# Patient Record
Sex: Female | Born: 1955 | Race: White | Hispanic: No | Marital: Married | Smoking: Never smoker
Health system: Southern US, Community
[De-identification: ages and names within clinical notes are randomized; demographics above are authoritative.]

## PROBLEM LIST (undated history)

## (undated) DIAGNOSIS — M810 Age-related osteoporosis without current pathological fracture: Secondary | ICD-10-CM

## (undated) DIAGNOSIS — B354 Tinea corporis: Secondary | ICD-10-CM

## (undated) DIAGNOSIS — I700 Atherosclerosis of aorta: Secondary | ICD-10-CM

## (undated) DIAGNOSIS — E041 Nontoxic single thyroid nodule: Secondary | ICD-10-CM

## (undated) DIAGNOSIS — E7800 Pure hypercholesterolemia, unspecified: Secondary | ICD-10-CM

## (undated) DIAGNOSIS — E785 Hyperlipidemia, unspecified: Secondary | ICD-10-CM

## (undated) HISTORY — PX: VEIN LIGATION AND STRIPPING: SHX2653

## (undated) HISTORY — PX: TUBAL LIGATION: SHX77

## (undated) HISTORY — DX: Pure hypercholesterolemia, unspecified: E78.00

## (undated) HISTORY — DX: Age-related osteoporosis without current pathological fracture: M81.0

## (undated) HISTORY — DX: Nontoxic single thyroid nodule: E04.1

## (undated) HISTORY — DX: Atherosclerosis of aorta: I70.0

## (undated) HISTORY — DX: Tinea corporis: B35.4

---

## 1998-06-28 ENCOUNTER — Other Ambulatory Visit: Admission: RE | Admit: 1998-06-28 | Discharge: 1998-06-28 | Payer: Self-pay | Admitting: Obstetrics and Gynecology

## 2000-10-25 ENCOUNTER — Emergency Department (HOSPITAL_COMMUNITY): Admission: EM | Admit: 2000-10-25 | Discharge: 2000-10-25 | Payer: Self-pay | Admitting: Emergency Medicine

## 2000-12-19 ENCOUNTER — Other Ambulatory Visit: Admission: RE | Admit: 2000-12-19 | Discharge: 2000-12-19 | Payer: Self-pay | Admitting: Obstetrics and Gynecology

## 2003-04-01 ENCOUNTER — Other Ambulatory Visit: Admission: RE | Admit: 2003-04-01 | Discharge: 2003-04-01 | Payer: Self-pay | Admitting: Obstetrics and Gynecology

## 2004-07-18 ENCOUNTER — Other Ambulatory Visit: Admission: RE | Admit: 2004-07-18 | Discharge: 2004-07-18 | Payer: Self-pay | Admitting: Obstetrics and Gynecology

## 2005-09-18 ENCOUNTER — Other Ambulatory Visit: Admission: RE | Admit: 2005-09-18 | Discharge: 2005-09-18 | Payer: Self-pay | Admitting: Obstetrics and Gynecology

## 2006-10-31 ENCOUNTER — Encounter: Admission: RE | Admit: 2006-10-31 | Discharge: 2006-10-31 | Payer: Self-pay | Admitting: Obstetrics and Gynecology

## 2006-12-03 ENCOUNTER — Encounter: Admission: RE | Admit: 2006-12-03 | Discharge: 2006-12-03 | Payer: Self-pay | Admitting: Surgery

## 2006-12-03 ENCOUNTER — Other Ambulatory Visit: Admission: RE | Admit: 2006-12-03 | Discharge: 2006-12-03 | Payer: Self-pay | Admitting: Diagnostic Radiology

## 2010-12-27 ENCOUNTER — Other Ambulatory Visit (HOSPITAL_COMMUNITY): Payer: Self-pay | Admitting: Obstetrics and Gynecology

## 2010-12-27 DIAGNOSIS — Z8639 Personal history of other endocrine, nutritional and metabolic disease: Secondary | ICD-10-CM

## 2011-01-01 ENCOUNTER — Other Ambulatory Visit (HOSPITAL_COMMUNITY): Payer: Self-pay

## 2011-01-21 ENCOUNTER — Other Ambulatory Visit (HOSPITAL_COMMUNITY): Payer: Self-pay

## 2011-01-22 ENCOUNTER — Ambulatory Visit (HOSPITAL_COMMUNITY)
Admission: RE | Admit: 2011-01-22 | Discharge: 2011-01-22 | Disposition: A | Discharge status: Home / Self Care | Payer: BC Managed Care – PPO | Source: Ambulatory Visit | Attending: Obstetrics and Gynecology | Admitting: Obstetrics and Gynecology

## 2011-01-22 ENCOUNTER — Other Ambulatory Visit (HOSPITAL_COMMUNITY): Payer: Self-pay | Admitting: Obstetrics and Gynecology

## 2011-01-22 DIAGNOSIS — E041 Nontoxic single thyroid nodule: Secondary | ICD-10-CM

## 2011-01-22 DIAGNOSIS — E042 Nontoxic multinodular goiter: Secondary | ICD-10-CM | POA: Insufficient documentation

## 2011-01-23 ENCOUNTER — Other Ambulatory Visit (HOSPITAL_COMMUNITY): Payer: BC Managed Care – PPO

## 2012-05-20 ENCOUNTER — Encounter (HOSPITAL_BASED_OUTPATIENT_CLINIC_OR_DEPARTMENT_OTHER): Payer: Self-pay

## 2012-05-20 ENCOUNTER — Emergency Department (HOSPITAL_BASED_OUTPATIENT_CLINIC_OR_DEPARTMENT_OTHER): Payer: BC Managed Care – PPO

## 2012-05-20 ENCOUNTER — Emergency Department (HOSPITAL_BASED_OUTPATIENT_CLINIC_OR_DEPARTMENT_OTHER)
Admission: EM | Admit: 2012-05-20 | Discharge: 2012-05-20 | Disposition: A | Discharge status: Home / Self Care | Payer: BC Managed Care – PPO | Attending: Emergency Medicine | Admitting: Emergency Medicine

## 2012-05-20 DIAGNOSIS — R000 Tachycardia, unspecified: Secondary | ICD-10-CM | POA: Insufficient documentation

## 2012-05-20 DIAGNOSIS — R002 Palpitations: Secondary | ICD-10-CM | POA: Insufficient documentation

## 2012-05-20 DIAGNOSIS — R209 Unspecified disturbances of skin sensation: Secondary | ICD-10-CM | POA: Insufficient documentation

## 2012-05-20 DIAGNOSIS — R55 Syncope and collapse: Secondary | ICD-10-CM

## 2012-05-20 DIAGNOSIS — E785 Hyperlipidemia, unspecified: Secondary | ICD-10-CM | POA: Insufficient documentation

## 2012-05-20 DIAGNOSIS — R42 Dizziness and giddiness: Secondary | ICD-10-CM | POA: Insufficient documentation

## 2012-05-20 DIAGNOSIS — R079 Chest pain, unspecified: Secondary | ICD-10-CM | POA: Insufficient documentation

## 2012-05-20 HISTORY — DX: Hyperlipidemia, unspecified: E78.5

## 2012-05-20 LAB — COMPREHENSIVE METABOLIC PANEL
ALT: 11 U/L (ref 0–35)
AST: 21 U/L (ref 0–37)
Alkaline Phosphatase: 103 U/L (ref 39–117)
BUN: 11 mg/dL (ref 6–23)
CO2: 25 mEq/L (ref 19–32)
GFR calc non Af Amer: >90 mL/min (ref >90)
Glucose, Bld: 122 mg/dL — ABNORMAL HIGH (ref 70–99)
Potassium: 4 mEq/L (ref 3.5–5.1)
Sodium: 139 mEq/L (ref 135–145)
Total Protein: 7.4 g/dL (ref 6.0–8.3)

## 2012-05-20 LAB — CBC WITH DIFFERENTIAL/PLATELET
Basophils Absolute: 0 10*3/uL (ref 0.0–0.1)
Eosinophils Relative: 1 % (ref 0–5)
Hemoglobin: 12.1 g/dL (ref 12.0–15.0)
Lymphs Abs: 1.4 10*3/uL (ref 0.7–4.0)
MCHC: 32.9 g/dL (ref 30.0–36.0)
Neutro Abs: 3.3 10*3/uL (ref 1.7–7.7)
Neutrophils Relative %: 61 % (ref 43–77)
Platelets: 287 10*3/uL (ref 150–400)
RDW: 12.5 % (ref 11.5–15.5)

## 2012-05-20 LAB — TROPONIN I: Troponin I: <0.3 ng/mL (ref <0.30)

## 2012-05-20 NOTE — ED Provider Notes (Signed)
History     CSN: [id number]  Arrival date & time 05/20/12  1234   First MD Initiated Contact with Patient 05/20/12 1245      Chief Complaint  Patient presents with  . Tachycardia  . Chest Pain    (Consider location/radiation/quality/duration/timing/severity/associated sxs/prior treatment) HPI Comments: Patient presents after episode of lightheadedness and palpitations while she was standing washing dishes. Symptoms came on suddenly and lasted about 45 minutes. She has down and symptoms improved. She developed tingling in her left arm with nausea, lightheadedness and chest pressure. The symptoms are now resolved. She's not had pain like this in the past. She admits to not eating or drinking today but has not ordered. No history of cardiac problems. She's a history of hyperlipidemia only. Has never had a stress test. She does not smoke. She's no abdominal pain, nausea vomiting. She denies any chest pain or shortness of breath. She feels back to baseline now.  The history is provided by the patient.    Past Medical History  Diagnosis Date  . Hyperlipemia     Past Surgical History  Procedure Date  . Tubal ligation   . Vein ligation and stripping     No family history on file.  History  Substance Use Topics  . Smoking status: Never Smoker   . Smokeless tobacco: Never Used  . Alcohol Use: 0.0 oz/week    1-2 Glasses of wine per week     daily    OB History    Grav Para Term Preterm Abortions TAB SAB Ect Mult Living                  Review of Systems  Constitutional: Negative for fever, activity change and appetite change.  HENT: Negative for congestion and rhinorrhea.   Respiratory: Positive for chest tightness. Negative for cough and shortness of breath.   Cardiovascular: Positive for palpitations. Negative for chest pain.  Gastrointestinal: Positive for nausea. Negative for vomiting and abdominal pain.  Genitourinary: Negative for dysuria, vaginal bleeding and vaginal  discharge.  Musculoskeletal: Negative for back pain.  Skin: Negative for rash.  Neurological: Positive for dizziness and light-headedness. Negative for weakness and headaches.    Allergies  Seconal and Codeine  Home Medications  No current outpatient prescriptions on file.  BP 110/79  Pulse 85  Temp 97.9 F (36.6 C) (Oral)  Resp 18  Ht 5\' 6"  (1.676 m)  Wt 130 lb (58.968 kg)  BMI 20.98 kg/m2  SpO2 100%  Physical Exam  Constitutional: She is oriented to person, place, and time. She appears well-developed and well-nourished. No distress.  HENT:  Head: Normocephalic and atraumatic.  Mouth/Throat: Oropharynx is clear and moist. No oropharyngeal exudate.  Eyes: Conjunctivae and EOM are normal. Pupils are equal, round, and reactive to light.  Neck: Normal range of motion. Neck supple.  Cardiovascular: Normal rate, regular rhythm and normal heart sounds.   No murmur heard. Pulmonary/Chest: Effort normal and breath sounds normal. No respiratory distress.  Abdominal: Soft. There is no tenderness. There is no rebound and no guarding.  Musculoskeletal: Normal range of motion. She exhibits no edema and no tenderness.  Neurological: She is alert and oriented to person, place, and time. No cranial nerve deficit.  Skin: Skin is warm.    ED Course  Procedures (including critical care time)  Labs Reviewed  COMPREHENSIVE METABOLIC PANEL - Abnormal; Notable for the following:    Glucose, Bld 122 (*)     All other components within  normal limits  CBC WITH DIFFERENTIAL  TROPONIN I  D-DIMER, QUANTITATIVE  TROPONIN I  URINALYSIS, ROUTINE W REFLEX MICROSCOPIC   Dg Chest 2 View  05/20/2012  *RADIOLOGY REPORT*  Clinical Data: Chest pain, palpitations  CHEST - 2 VIEW  Comparison: Chest x-ray of 12/03/2006  Findings: The lungs remain clear and slightly hyperaerated.  No focal infiltrate or effusion is seen.  Mediastinal contours appear normal.  The heart is within normal limits in size.  No  bony abnormality is seen.  IMPRESSION: No change in hyperaeration.  No active lung disease.   Original Report Authenticated By: Juline Patch, M.D.      1. Palpitations   2. Near syncope       MDM  Episode of lightheadedness, palpitations, nausea, tingling in the arm and chest pressure, resolved after 45 minutes. Initially tachycardic to 140s on arrival, but 90s by the time monitor applied.  EKG nonischemic, vital stable, no distress. Low suspicion for ACS. TIMI 0. Possibility of resolved SVT.  Possible component of anxiety.  Troponin negative, d-dimer negative, no recurrence of symptoms. Discussed with patient the low likelihood of ACS. She is agreeable to outpatient followup. declines observation today. She will schedule stress test with her Dr. Doreatha Lew return to the ED with no worsening symptoms.    Date: 05/20/2012  Rate: 87  Rhythm: normal sinus rhythm  QRS Axis: normal  Intervals: normal  ST/T Wave abnormalities: normal  Conduction Disutrbances:none  Narrative Interpretation:   Old EKG Reviewed: none available    Glynn Octave, MD 05/20/12 1540

## 2012-05-20 NOTE — ED Notes (Signed)
Pt reports palpitations, chest pressure and tingling in left arm.

## 2012-05-20 NOTE — ED Notes (Signed)
Patient transported to X-ray 

## 2012-05-20 NOTE — ED Notes (Signed)
Pt reports palpitations PTA lasting over 45 minutes.  Initial pulse in ED was 145 via radial pulse, however decreased to 88-89 and NSR once monitor was in place.

## 2014-04-30 ENCOUNTER — Encounter: Payer: Self-pay | Admitting: *Deleted

## 2014-07-19 ENCOUNTER — Other Ambulatory Visit: Payer: Self-pay | Admitting: Obstetrics and Gynecology

## 2014-07-20 LAB — CYTOLOGY - PAP

## 2014-07-21 ENCOUNTER — Other Ambulatory Visit (HOSPITAL_COMMUNITY): Payer: Self-pay | Admitting: Obstetrics and Gynecology

## 2014-07-21 DIAGNOSIS — E041 Nontoxic single thyroid nodule: Secondary | ICD-10-CM

## 2014-07-28 ENCOUNTER — Ambulatory Visit (HOSPITAL_COMMUNITY): Payer: BC Managed Care – PPO

## 2014-08-02 ENCOUNTER — Ambulatory Visit (HOSPITAL_COMMUNITY)
Admission: RE | Admit: 2014-08-02 | Discharge: 2014-08-02 | Disposition: A | Discharge status: Home / Self Care | Payer: BC Managed Care – PPO | Source: Ambulatory Visit | Attending: Obstetrics and Gynecology | Admitting: Obstetrics and Gynecology

## 2014-08-02 DIAGNOSIS — E041 Nontoxic single thyroid nodule: Secondary | ICD-10-CM

## 2016-02-26 IMAGING — US US SOFT TISSUE HEAD/NECK
1 series · 13 of 25 positions shown · non-contrast
Comparison: 01/22/2011; 10/31/2006; ultrasound-guided left-sided
thyroid nodule biopsy - 12/03/2006

CLINICAL DATA: Evaluate left-sided thyroid nodule.

EXAM:
THYROID ULTRASOUND
TECHNIQUE: Ultrasound examination of the thyroid gland and adjacent soft
tissues was performed.

[Series 1: us soft tissue head/neck · 0.06mm/px · 13 of 66 slices shown]
[im 1/66]
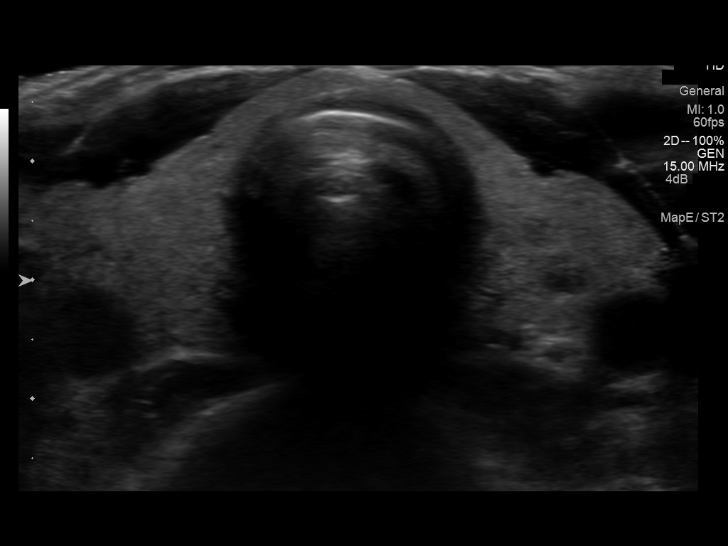
[im 6/66]
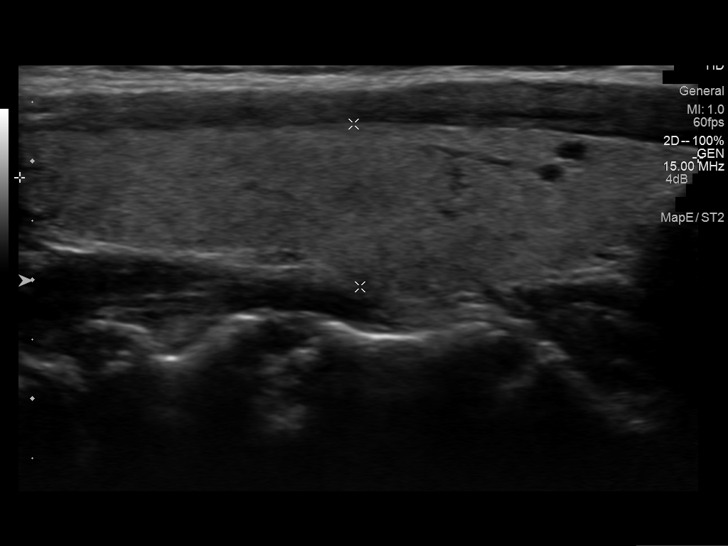
[im 11/66]
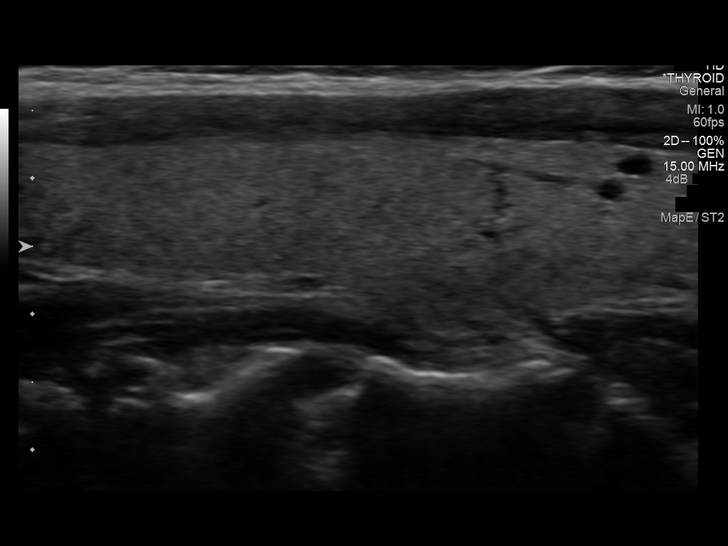
[im 17/66]
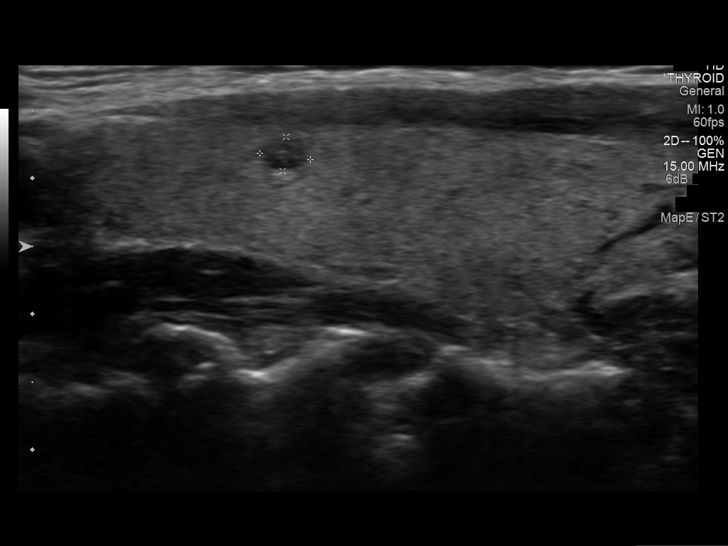
[im 22/66]
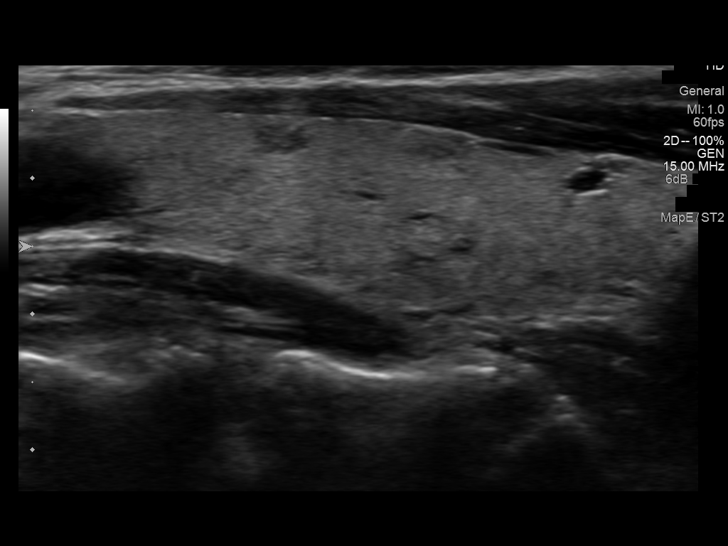
[im 28/66]
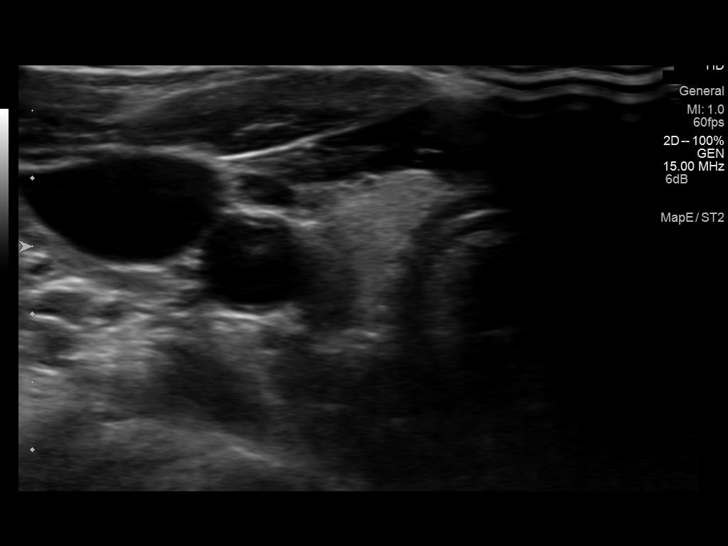
[im 33/66]
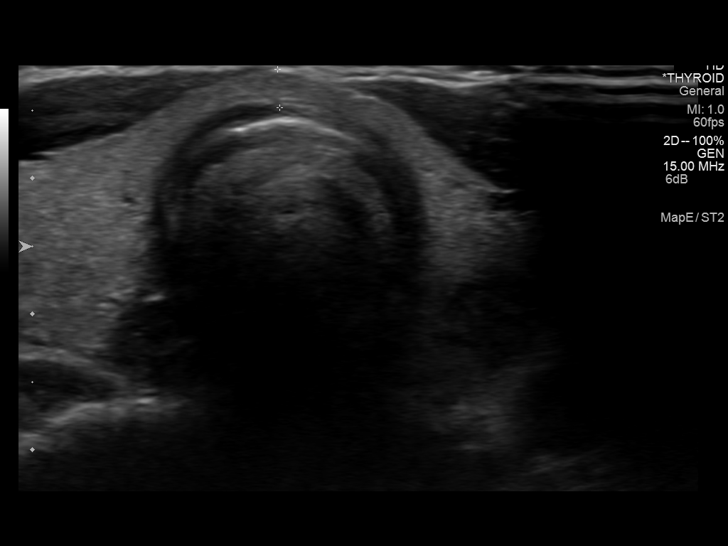
[im 38/66]
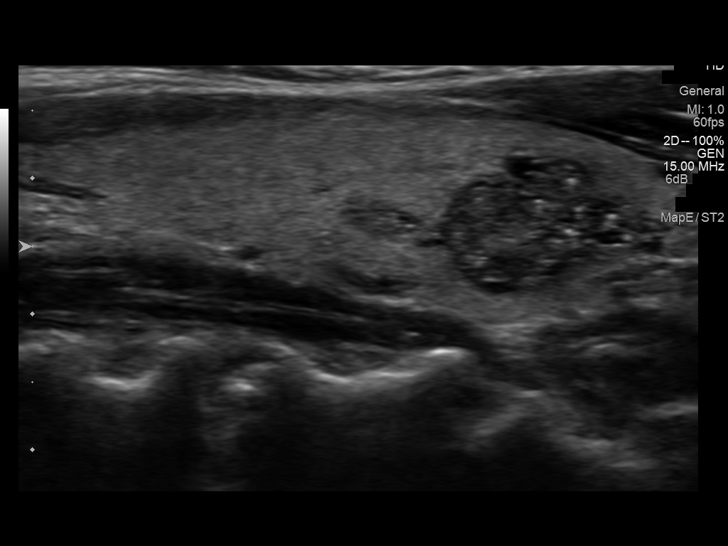
[im 44/66]
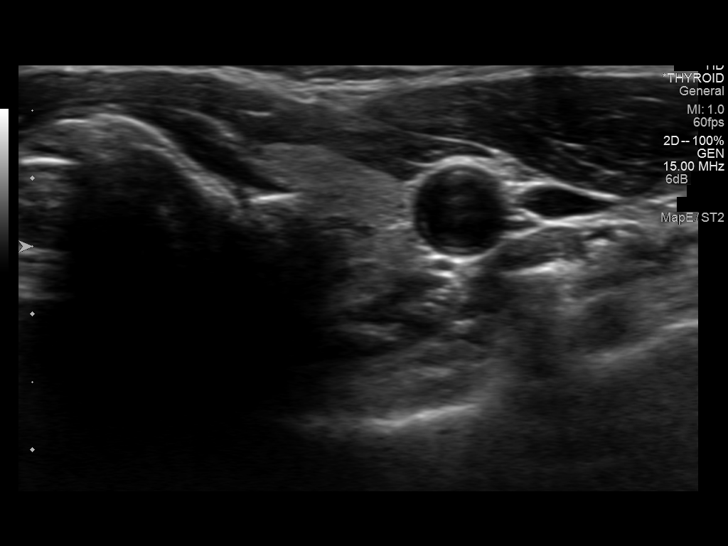
[im 49/66]
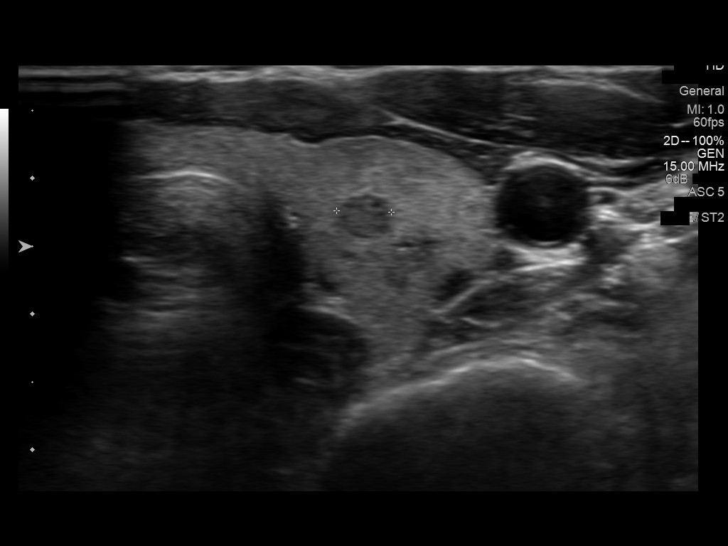
[im 55/66]
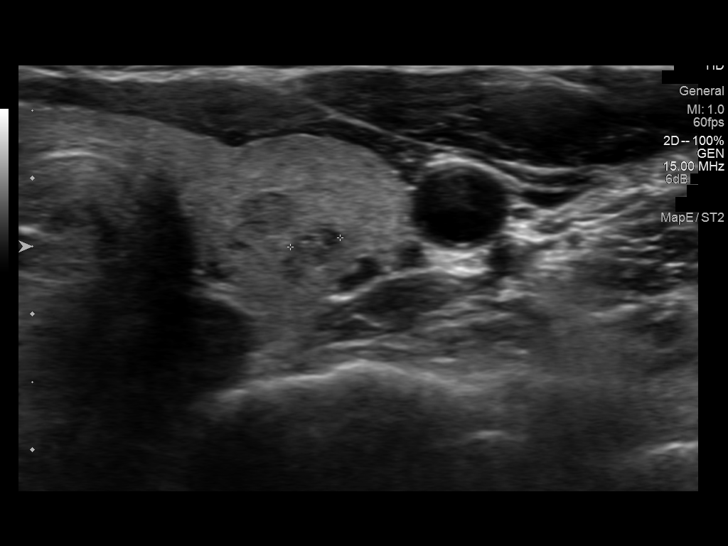
[im 60/66]
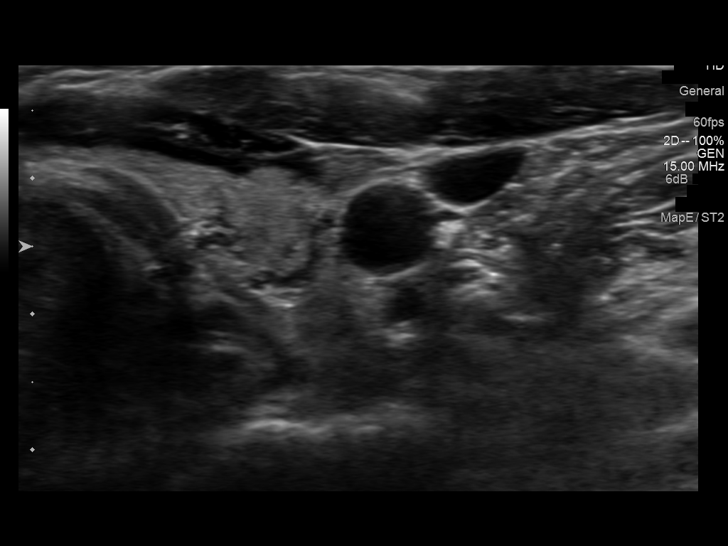
[im 66/66]
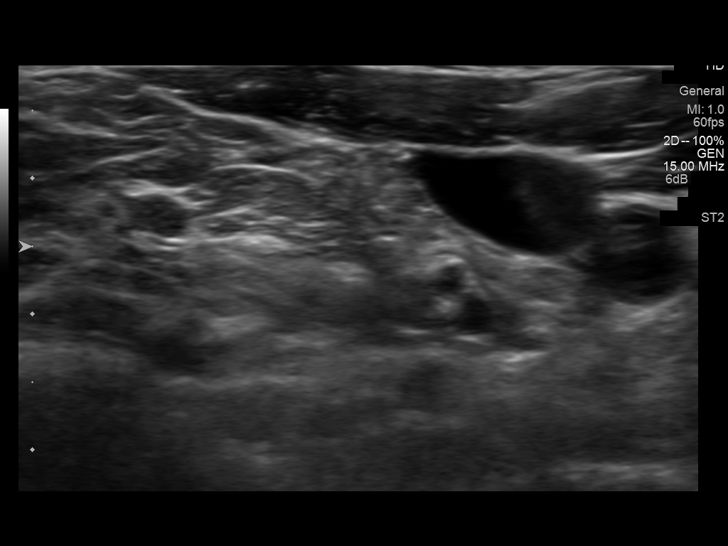

[13 of 25 positions shown; findings below may reference images not displayed]

FINDINGS: No definitive worrisome new or enlarging thyroid nodules.

Right thyroid lobe

Measurements: Borderline enlarged measuring 5.7 x 1.4 x 2.0 cm,
unchanged, previously, 5.6 x 1.8 x 1.9 cm.

Right, superior - 0.4 x 0.3 x 0.3 cm - hypoechoic, likely cystic -
grossly unchanged since the 8008 examination

Right, mid, anterior - 0.4 x 0.2 x 0.4 cm - hypoechoic, likely
cystic - grossly unchanged since the 8008 examination.

Right, inferior, anterior - 0.2 x 0.2 x 0.2 cm-anechoic, likely
cystic - not definitely seen on the prior examinations.

Left thyroid lobe

Measurements: Borderline enlarged measuring 5.6 x 1.4 x 1.6 cm,
unchanged, previously, 5.6 x 1.6 x 1.7 cm. No nodules visualized.

Left, mid - 0.4 x 0.6 x 0.4 cm - hypoechoic, solid - grossly
unchanged since the 8008 examination.

Left, mid - 0.7 x 0.3 x 0.4 cm - mixed echogenic, solid - grossly
unchanged since the 8008 examination.

Left, inferior - 1.4 x 1.1 x 0.8 cm-mixed echogenic - grossly
unchanged since the 8008 examination. Note, this nodule was
previously biopsied.

Isthmus

Thickness: Normal in size measures 0.3 cm in diameter, unchanged..

No discrete nodules identified within the thyroid isthmus.

Lymphadenopathy

None visualized.
IMPRESSION: Similar findings of multi nodular goiter. No definitive worrisome
new or enlarging thyroid nodules with the majority of thyroid
nodules being stable since the 8008 examination.

## 2016-12-05 ENCOUNTER — Other Ambulatory Visit: Payer: Self-pay | Admitting: Obstetrics and Gynecology

## 2016-12-05 DIAGNOSIS — E041 Nontoxic single thyroid nodule: Secondary | ICD-10-CM

## 2016-12-12 ENCOUNTER — Other Ambulatory Visit: Payer: Self-pay

## 2016-12-18 ENCOUNTER — Ambulatory Visit
Admission: RE | Admit: 2016-12-18 | Discharge: 2016-12-18 | Disposition: A | Discharge status: Home / Self Care | Payer: BLUE CROSS/BLUE SHIELD | Source: Ambulatory Visit | Attending: Obstetrics and Gynecology | Admitting: Obstetrics and Gynecology

## 2016-12-18 DIAGNOSIS — E041 Nontoxic single thyroid nodule: Secondary | ICD-10-CM

## 2017-03-14 ENCOUNTER — Other Ambulatory Visit: Payer: Self-pay | Admitting: Obstetrics and Gynecology

## 2017-03-14 DIAGNOSIS — E041 Nontoxic single thyroid nodule: Secondary | ICD-10-CM

## 2017-04-09 ENCOUNTER — Other Ambulatory Visit (HOSPITAL_COMMUNITY)
Admission: RE | Admit: 2017-04-09 | Discharge: 2017-04-09 | Disposition: A | Discharge status: Home / Self Care | Payer: BLUE CROSS/BLUE SHIELD | Source: Ambulatory Visit | Attending: Physician Assistant | Admitting: Physician Assistant

## 2017-04-09 ENCOUNTER — Ambulatory Visit
Admission: RE | Admit: 2017-04-09 | Discharge: 2017-04-09 | Disposition: A | Discharge status: Home / Self Care | Payer: BLUE CROSS/BLUE SHIELD | Source: Ambulatory Visit | Attending: Obstetrics and Gynecology | Admitting: Obstetrics and Gynecology

## 2017-04-09 DIAGNOSIS — E041 Nontoxic single thyroid nodule: Secondary | ICD-10-CM

## 2017-04-09 DIAGNOSIS — D34 Benign neoplasm of thyroid gland: Secondary | ICD-10-CM | POA: Diagnosis not present

## 2017-04-09 NOTE — Procedures (Signed)
PROCEDURE SUMMARY:  Using direct ultrasound guidance, 4 passes were made using 25 g needles into the nodule within the left lobe of the thyroid.   Ultrasound was used to confirm needle placements on all occasions.   Specimens were sent to Pathology for analysis.  Judie Grieve Tonantzin Mimnaugh PA-C 04/09/2017 3:39 PM

## 2017-05-06 IMAGING — US US THYROID
1 series · 13 of 25 positions shown · non-contrast
Comparison: 08/02/2014, 01/22/2011 and 10/31/2006

CLINICAL DATA: Prior ultrasound follow-up. History of thyroid
nodules with prior fine-needle aspiration of left inferior thyroid
nodule on 12/03/2006.

EXAM:
THYROID ULTRASOUND
TECHNIQUE: Ultrasound examination of the thyroid gland and adjacent soft
tissues was performed.

[Series 1: us thyroid · 0.04mm/px · 13 of 48 slices shown]
[im 1/48]
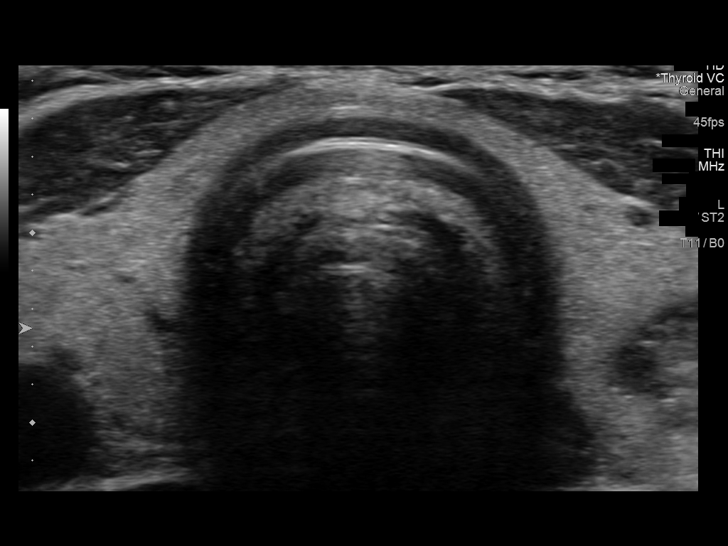
[im 4/48]
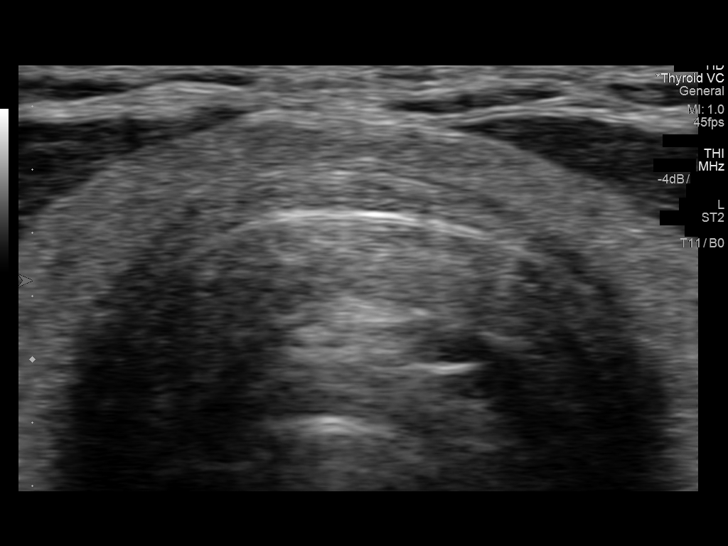
[im 8/48]
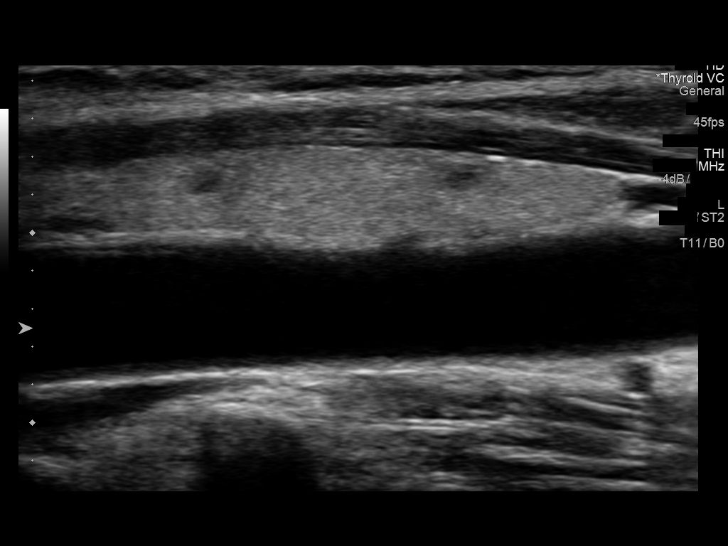
[im 12/48]
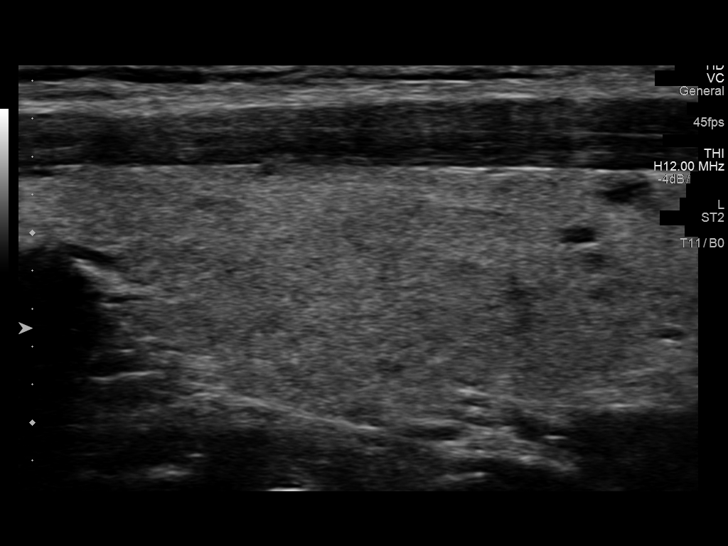
[im 16/48]
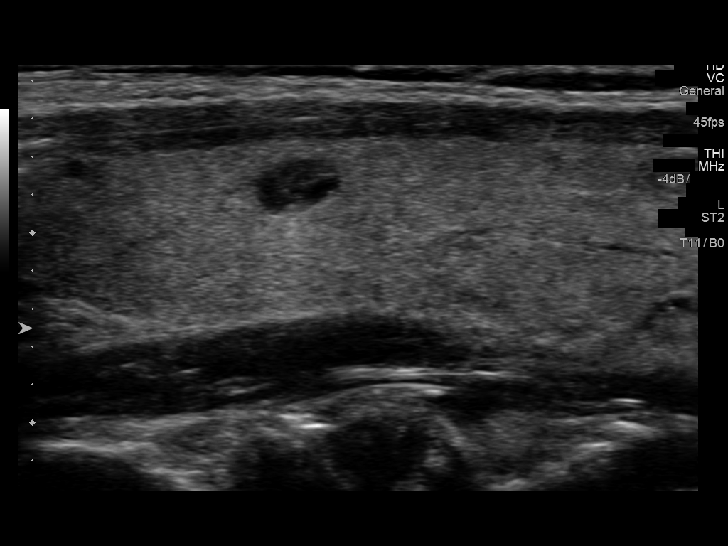
[im 20/48]
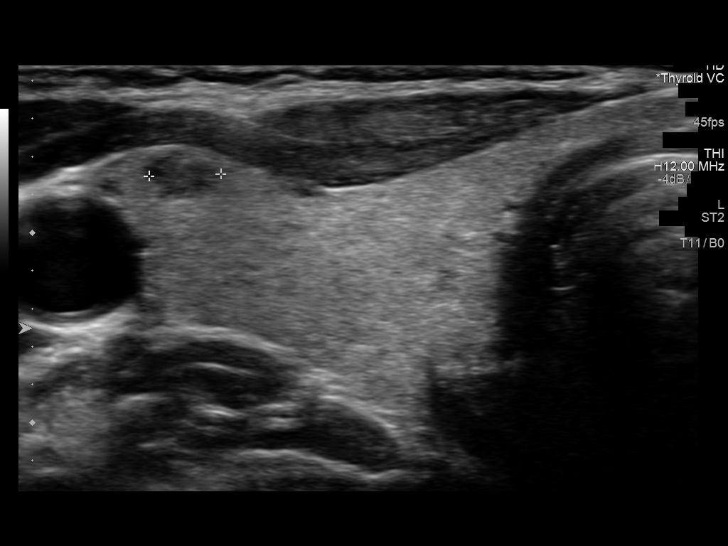
[im 24/48]
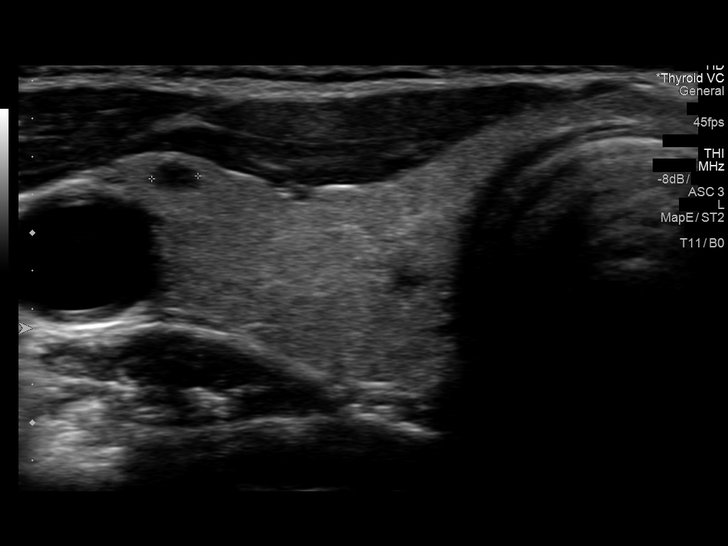
[im 28/48]
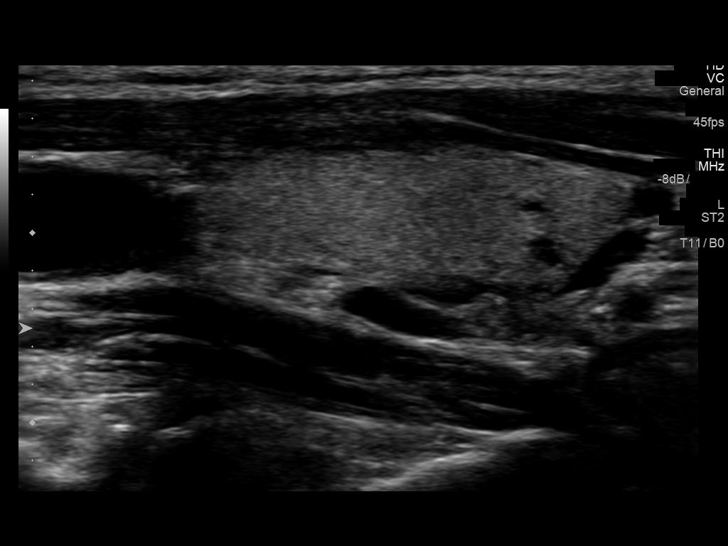
[im 32/48]
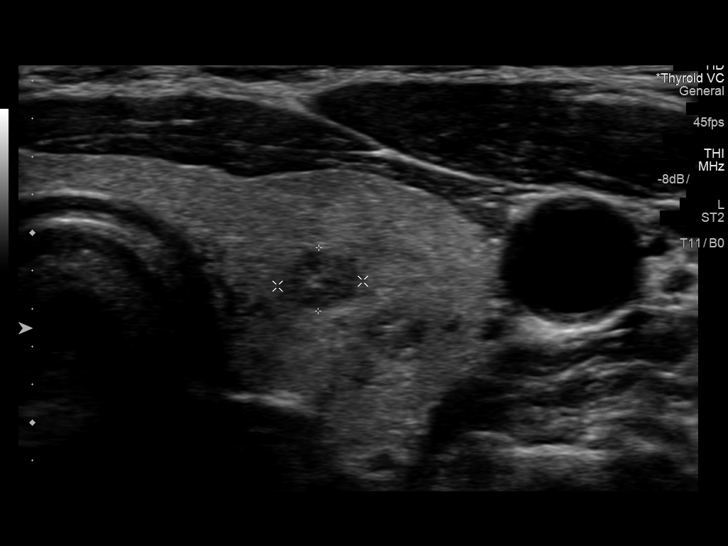
[im 36/48]
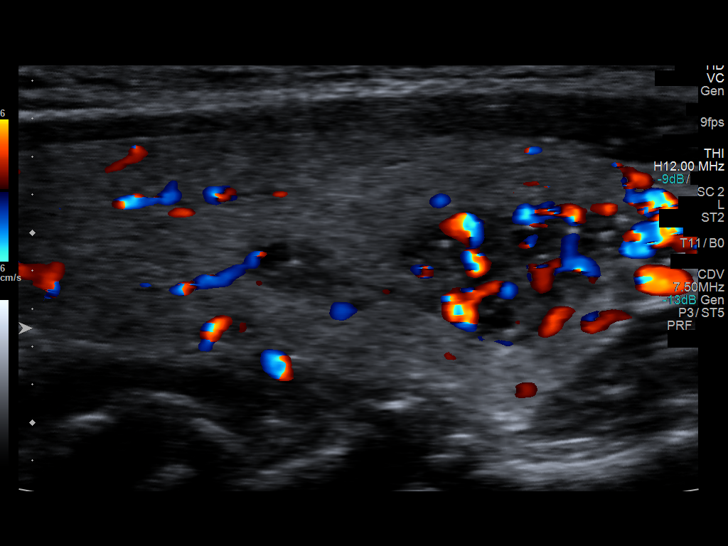
[im 40/48]
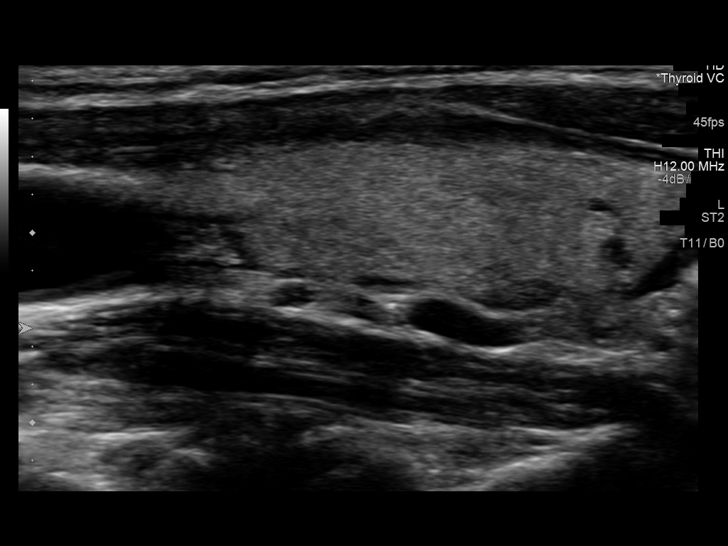
[im 44/48]
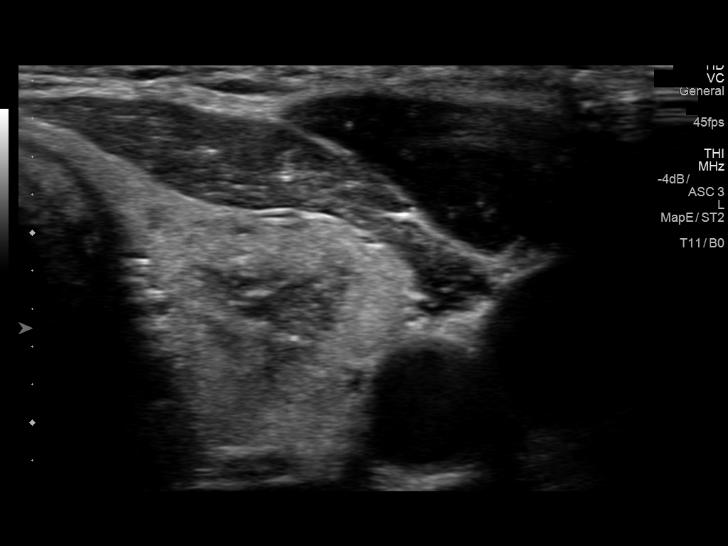
[im 48/48]
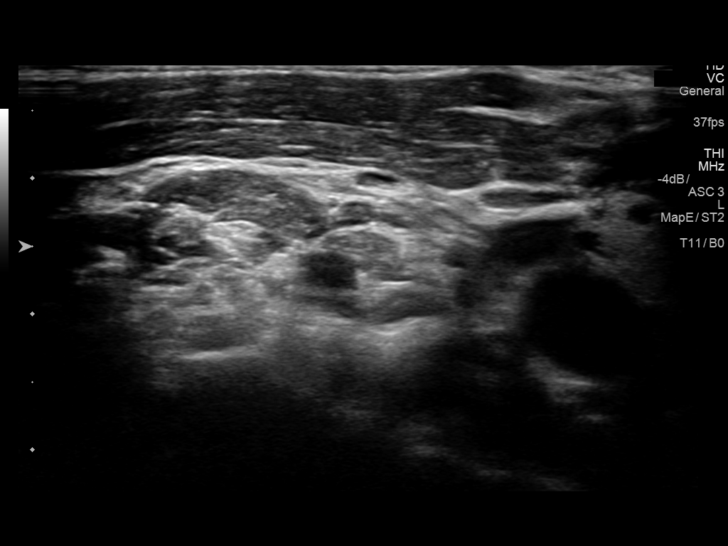

[13 of 25 positions shown; findings below may reference images not displayed]

FINDINGS: Parenchymal Echotexture: Mildly heterogenous

Isthmus: 0.3 cm

Right lobe: 5.6 x 1.4 x 1.9 cm

Left lobe: 5.5 x 1.5 x 1.5 cm

_________________________________________________________

Estimated total number of nodules >/= 1 cm: 1

Number of spongiform nodules >/=  2 cm not described below (TR1): 0

Number of mixed cystic and solid nodules >/= 1.5 cm not described
below (TR2): 0

_________________________________________________________

Nodule # 1:

Prior biopsy: Yes

Location: Left; Inferior

Maximum size: 1.4 cm; Other 2 dimensions: 0.9 x 0.8 cm, previously,
1.4 cm

Composition: solid/almost completely solid (2)

Echogenicity: hypoechoic (2)

Shape: taller-than-wide (3)

Margins: lobulated/irregular (2)

Echogenic foci: punctate echogenic foci (3)

ACR TI-RADS total points: 12.

ACR TI-RADS risk category:  TR5 (>/= 7 points).

Significant change in size (>/= 20% in two dimensions and minimal
increase of 2 mm): No

Change in features: Yes

Change in ACR TI-RADS risk category: Yes

ACR TI-RADS recommendations:

Correlation with prior cytology in 9779 is recommended. Even if that
cytology was benign, the nodule appears slightly more lobulated over
the last 2 studies and also more hypoechoic. This nodule is also now
more solid in appearance compared to a partially solid/ cystic
appearance in 9779. Although stable in size, given some change in
morphology over time, repeat fine-needle aspiration should be
considered.

_________________________________________________________

0.6 cm cystic nodule in the superior left lobe, 0.7 cm nodule in the
mid left lobe and scattered subcentimeter cysts in the right lobe
appear stable since prior studies.
IMPRESSION: The 1.4 cm inferior left thyroid nodule does show some concerning
features and is more lobulated appearing and more hypoechoic
compared to the prior studies in 3721 and 5265. Although stable in
size, given change in morphology over time, repeat fine-needle
aspiration of this nodule should be considered.

The above is in keeping with the ACR TI-RADS recommendations - [HOSPITAL] 7396;[DATE].

## 2017-08-26 IMAGING — US US THYROID BIOPSY
1 series · 13 of 13 positions shown · non-contrast
Comparison: Ultrasound done 12/18/2016

MEDICATIONS:
1% Lidocaine = 2 mL

COMPLICATIONS:
None immediate.

INDICATION: Indeterminate left thyroid nodule

EXAM:
ULTRASOUND GUIDED FINE NEEDLE ASPIRATION OF INDETERMINATE LEFT
THYROID NODULE
TECHNIQUE: Informed written consent was obtained from the patient after a
discussion of the risks, benefits and alternatives to treatment.
Questions regarding the procedure were encouraged and answered. A
timeout was performed prior to the initiation of the procedure.

[Series 1: us thyroid biopsy · 0.04mm/px · 13 acquisitions, 13 frames shown]
[im 1/13]
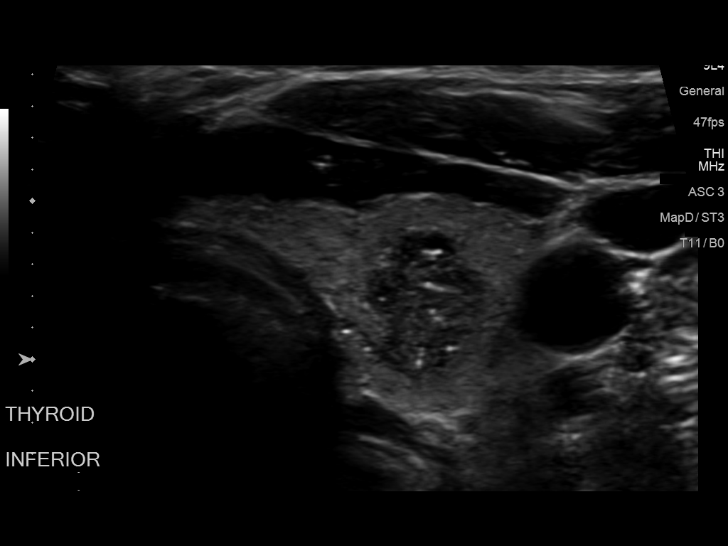
[im 2/13]
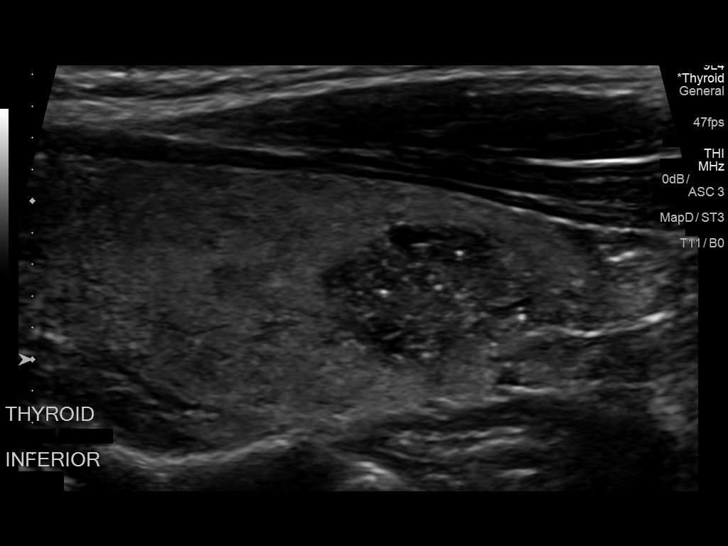
[im 3/13]
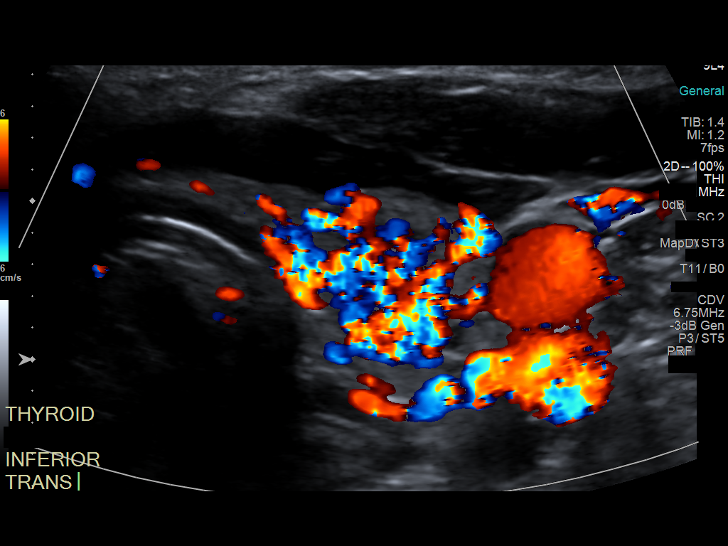
[im 4/13]
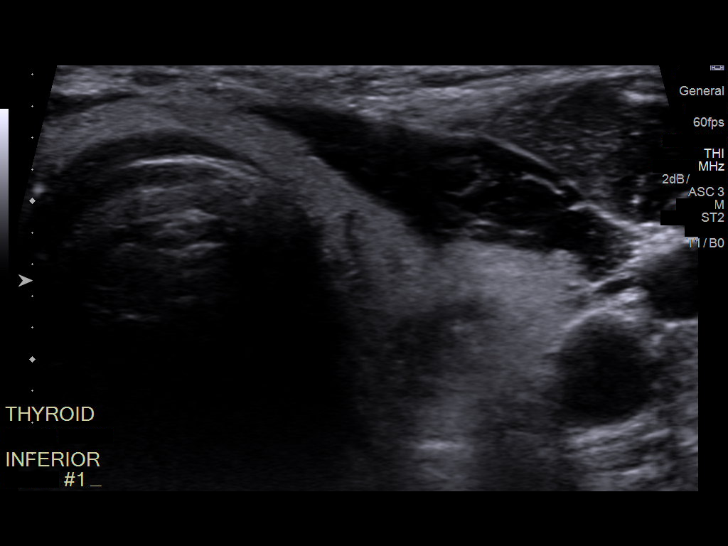
[im 5/13]
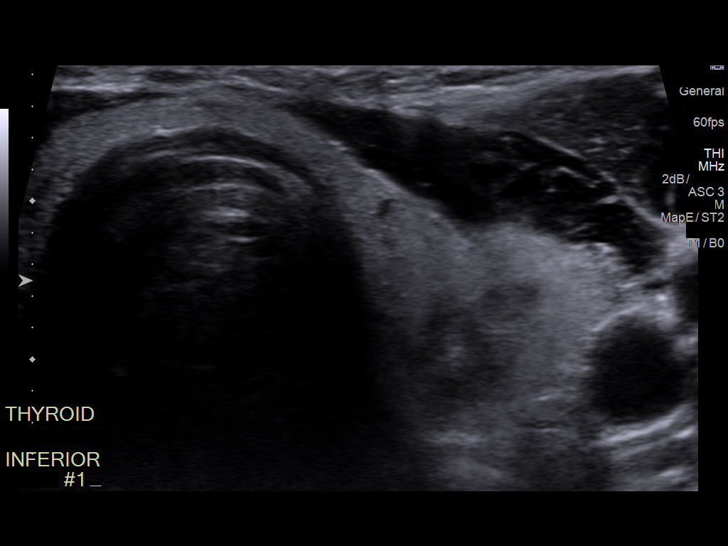
[im 6/13]
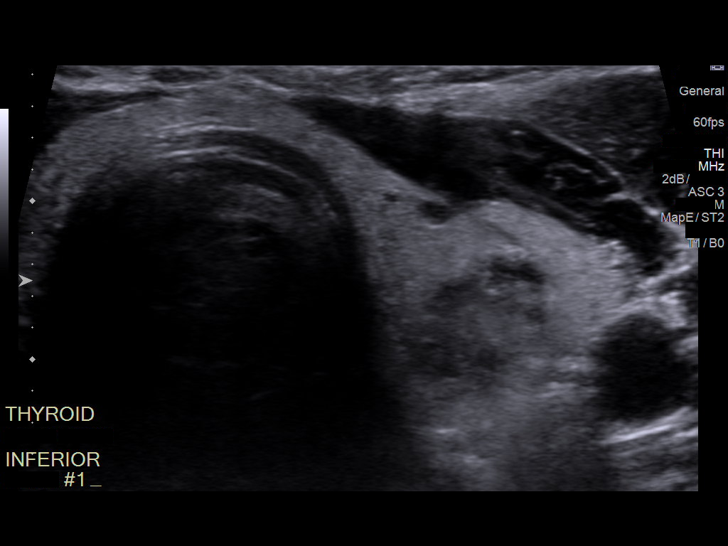
[im 7/13]
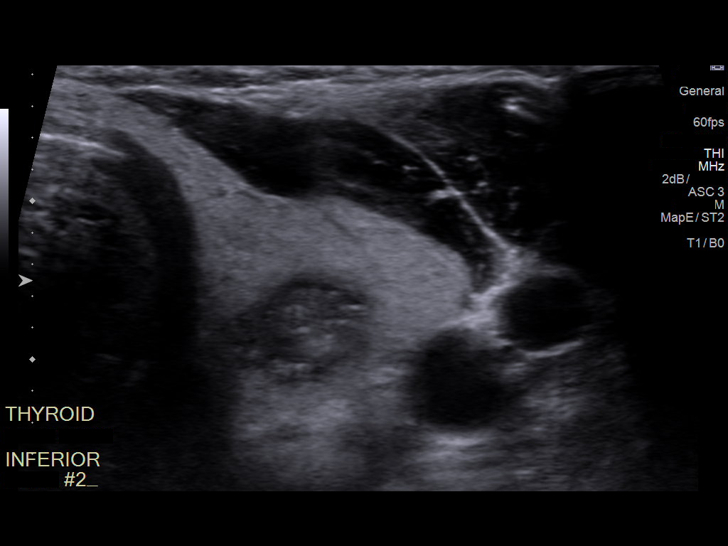
[im 8/13]
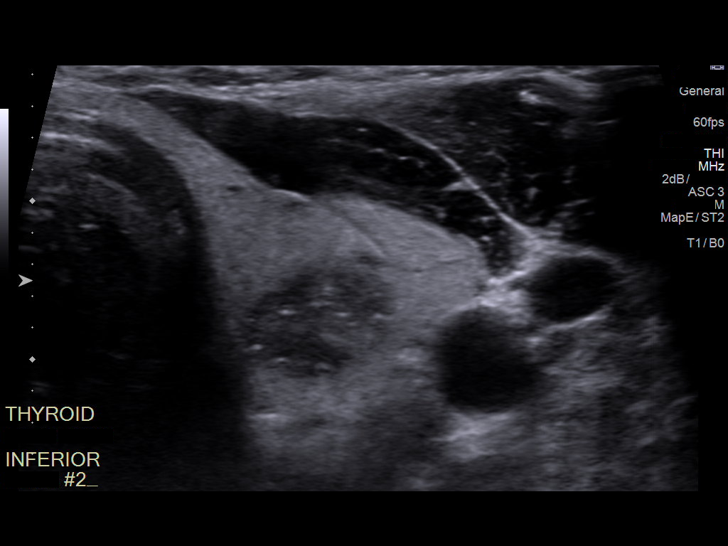
[im 9/13]
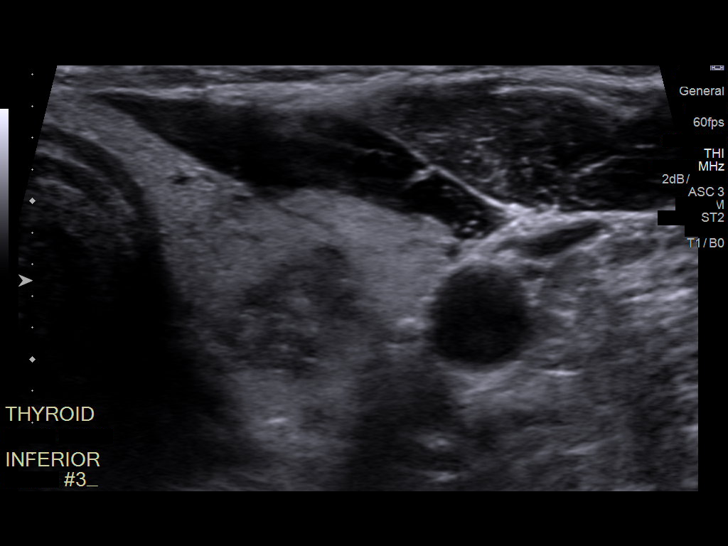
[im 10/13]
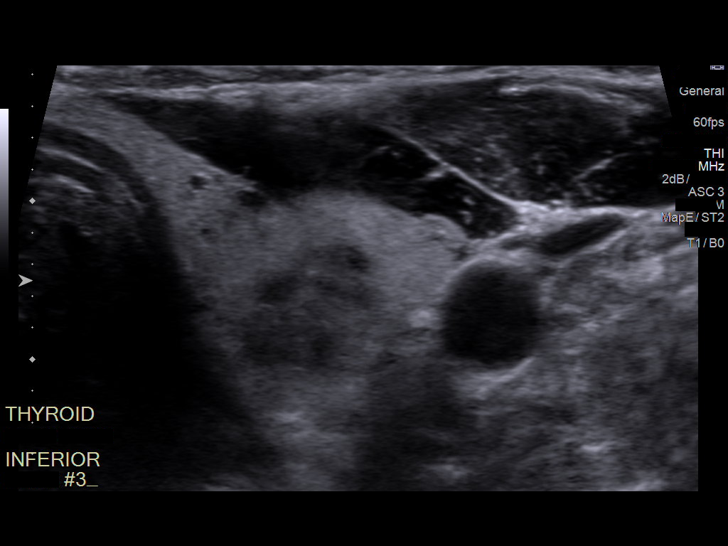
[im 11/13]
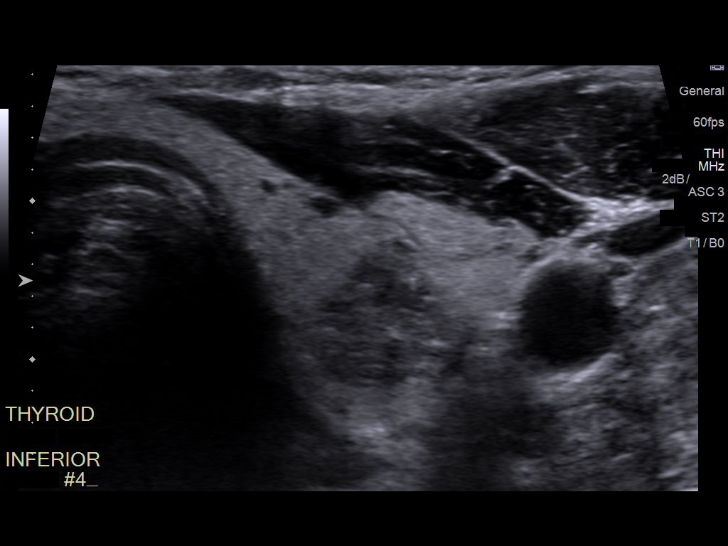
[im 12/13]
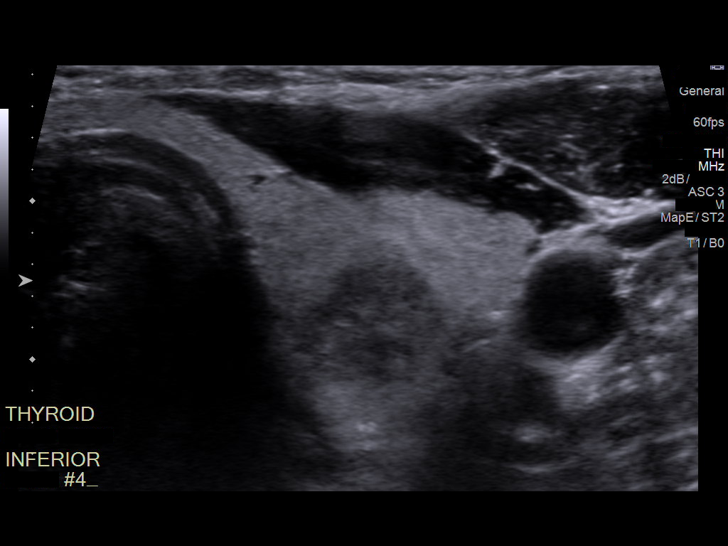
[im 13/13]
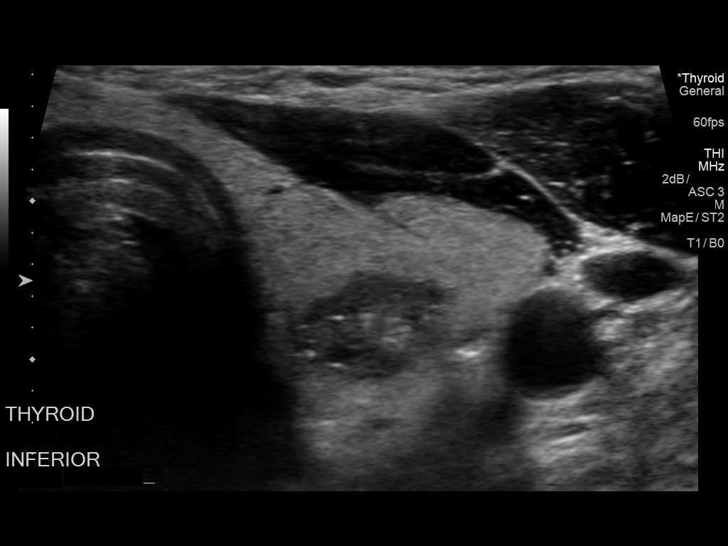

[13 of 13 positions shown; findings below may reference images not displayed]

Pre-procedural ultrasound scanning demonstrated unchanged size and
appearance of the indeterminate nodule within the left lobe of the
thyroid.

The procedure was planned. The neck was prepped in the usual sterile
fashion, and a sterile drape was applied covering the operative
field. A timeout was performed prior to the initiation of the
procedure. Local anesthesia was provided with 1% lidocaine.

Under direct ultrasound guidance, 4 FNA biopsies were performed of
the left thyroid nodule with a 25 gauge needle. Multiple ultrasound
images were saved for procedural documentation purposes. The samples
were prepared and submitted to pathology.

Limited post procedural scanning was negative for hematoma or
additional complication. Dressings were placed. The patient
tolerated the above procedures procedure well without immediate
postprocedural complication.
FINDINGS: Nodule reference number based on prior diagnostic ultrasound: 1

Maximum size:  1.4 cm

Location: Left; Inferior

ACR TI-RADS risk category: TR5 (>/= 7 points)

Reason for biopsy: meets ACR TI-RADS criteria

Ultrasound imaging confirms appropriate placement of the needles
within the thyroid nodule.
IMPRESSION: Technically successful ultrasound guided fine needle aspiration of
the left thyroid nodule.

## 2020-04-06 DIAGNOSIS — Z20822 Contact with and (suspected) exposure to covid-19: Secondary | ICD-10-CM | POA: Diagnosis not present

## 2020-04-06 DIAGNOSIS — Z03818 Encounter for observation for suspected exposure to other biological agents ruled out: Secondary | ICD-10-CM | POA: Diagnosis not present

## 2020-06-01 DIAGNOSIS — Z03818 Encounter for observation for suspected exposure to other biological agents ruled out: Secondary | ICD-10-CM | POA: Diagnosis not present

## 2020-06-01 DIAGNOSIS — Z1152 Encounter for screening for COVID-19: Secondary | ICD-10-CM | POA: Diagnosis not present

## 2020-06-01 DIAGNOSIS — R0981 Nasal congestion: Secondary | ICD-10-CM | POA: Diagnosis not present

## 2021-03-28 DIAGNOSIS — Z01419 Encounter for gynecological examination (general) (routine) without abnormal findings: Secondary | ICD-10-CM | POA: Diagnosis not present

## 2021-03-28 DIAGNOSIS — Z6820 Body mass index (BMI) 20.0-20.9, adult: Secondary | ICD-10-CM | POA: Diagnosis not present

## 2021-03-29 ENCOUNTER — Other Ambulatory Visit: Payer: Self-pay | Admitting: Obstetrics and Gynecology

## 2021-03-29 DIAGNOSIS — E041 Nontoxic single thyroid nodule: Secondary | ICD-10-CM

## 2021-03-29 DIAGNOSIS — Z01419 Encounter for gynecological examination (general) (routine) without abnormal findings: Secondary | ICD-10-CM | POA: Diagnosis not present

## 2021-04-04 ENCOUNTER — Other Ambulatory Visit: Payer: BLUE CROSS/BLUE SHIELD

## 2021-04-13 ENCOUNTER — Ambulatory Visit
Admission: RE | Admit: 2021-04-13 | Discharge: 2021-04-13 | Disposition: A | Discharge status: Home / Self Care | Payer: BLUE CROSS/BLUE SHIELD | Source: Ambulatory Visit | Attending: Obstetrics and Gynecology | Admitting: Obstetrics and Gynecology

## 2021-04-13 DIAGNOSIS — E041 Nontoxic single thyroid nodule: Secondary | ICD-10-CM

## 2021-05-30 DIAGNOSIS — M810 Age-related osteoporosis without current pathological fracture: Secondary | ICD-10-CM | POA: Diagnosis not present

## 2021-05-30 DIAGNOSIS — Z1231 Encounter for screening mammogram for malignant neoplasm of breast: Secondary | ICD-10-CM | POA: Diagnosis not present

## 2021-05-30 DIAGNOSIS — Z1329 Encounter for screening for other suspected endocrine disorder: Secondary | ICD-10-CM | POA: Diagnosis not present

## 2021-05-30 DIAGNOSIS — Z13820 Encounter for screening for osteoporosis: Secondary | ICD-10-CM | POA: Diagnosis not present

## 2021-05-30 DIAGNOSIS — Z13228 Encounter for screening for other metabolic disorders: Secondary | ICD-10-CM | POA: Diagnosis not present

## 2021-05-30 DIAGNOSIS — Z1321 Encounter for screening for nutritional disorder: Secondary | ICD-10-CM | POA: Diagnosis not present

## 2021-05-30 DIAGNOSIS — E785 Hyperlipidemia, unspecified: Secondary | ICD-10-CM | POA: Diagnosis not present

## 2021-06-26 DIAGNOSIS — E785 Hyperlipidemia, unspecified: Secondary | ICD-10-CM | POA: Diagnosis not present

## 2021-10-03 DIAGNOSIS — Z01818 Encounter for other preprocedural examination: Secondary | ICD-10-CM | POA: Diagnosis not present

## 2021-12-27 DIAGNOSIS — D124 Benign neoplasm of descending colon: Secondary | ICD-10-CM | POA: Diagnosis not present

## 2021-12-27 DIAGNOSIS — D123 Benign neoplasm of transverse colon: Secondary | ICD-10-CM | POA: Diagnosis not present

## 2021-12-27 DIAGNOSIS — K649 Unspecified hemorrhoids: Secondary | ICD-10-CM | POA: Diagnosis not present

## 2021-12-27 DIAGNOSIS — D125 Benign neoplasm of sigmoid colon: Secondary | ICD-10-CM | POA: Diagnosis not present

## 2021-12-27 DIAGNOSIS — Z86010 Personal history of colonic polyps: Secondary | ICD-10-CM | POA: Diagnosis not present

## 2021-12-27 DIAGNOSIS — D122 Benign neoplasm of ascending colon: Secondary | ICD-10-CM | POA: Diagnosis not present

## 2022-04-17 DIAGNOSIS — Z681 Body mass index (BMI) 19 or less, adult: Secondary | ICD-10-CM | POA: Diagnosis not present

## 2022-04-17 DIAGNOSIS — Z01419 Encounter for gynecological examination (general) (routine) without abnormal findings: Secondary | ICD-10-CM | POA: Diagnosis not present

## 2022-04-25 ENCOUNTER — Other Ambulatory Visit: Payer: Self-pay | Admitting: Obstetrics and Gynecology

## 2022-04-25 DIAGNOSIS — Z8249 Family history of ischemic heart disease and other diseases of the circulatory system: Secondary | ICD-10-CM

## 2022-07-18 ENCOUNTER — Other Ambulatory Visit: Payer: BC Managed Care – PPO

## 2022-07-19 ENCOUNTER — Ambulatory Visit
Admission: RE | Admit: 2022-07-19 | Discharge: 2022-07-19 | Disposition: A | Discharge status: Home / Self Care | Payer: No Typology Code available for payment source | Source: Ambulatory Visit | Attending: Obstetrics and Gynecology | Admitting: Obstetrics and Gynecology

## 2022-07-19 DIAGNOSIS — Z8249 Family history of ischemic heart disease and other diseases of the circulatory system: Secondary | ICD-10-CM

## 2022-07-19 DIAGNOSIS — I700 Atherosclerosis of aorta: Secondary | ICD-10-CM | POA: Diagnosis not present

## 2022-08-21 DIAGNOSIS — U071 COVID-19: Secondary | ICD-10-CM | POA: Diagnosis not present

## 2022-09-03 ENCOUNTER — Encounter: Payer: Self-pay | Admitting: Internal Medicine

## 2022-09-03 ENCOUNTER — Ambulatory Visit: Payer: BC Managed Care – PPO | Attending: Internal Medicine | Admitting: Internal Medicine

## 2022-09-03 VITALS — BP 110/70 | HR 66 | Ht 66.0 in | Wt 129.0 lb

## 2022-09-03 DIAGNOSIS — Z13220 Encounter for screening for lipoid disorders: Secondary | ICD-10-CM

## 2022-09-03 NOTE — Progress Notes (Signed)
Cardiology Office Note   Date:  09/03/2022   ID:  Amber Lang, DOB [birth year], MRN [id number]  PCP:  Patient, No Pcp Per  Cardiologist:   Dorris Carnes, MD   Patient presents for evaluation of HL and atherosclerosis of aorta    History of Present Illness: Amber Lang is a 66 y.o. 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   rosuvastatin (CRESTOR) 10 MG tablet Take 10 mg by mouth at bedtime.     Allergies:   Seconal [secobarbital sodium], Codeine, and Entex lq [phenylephrine-guaifenesin]   Past Medical History:  Diagnosis Date   Atherosclerosis of aorta ([place])    Hyperlipemia    Osteoporosis    Pure hypercholesterolemia    Thyroid nodule    Tinea corporis     Past Surgical History:  Procedure Laterality Date   TUBAL LIGATION     VEIN LIGATION AND STRIPPING       Social History:  The patient  reports that she has never smoked. She has never used smokeless tobacco. She reports current alcohol use of about 1.0 - 2.0 standard drink of alcohol per week. She reports that she does not use drugs.   Family History:  The patient's family history includes Diabetes in her father; Heart attack in her father.    ROS:  Please see the history of present illness. All other systems are reviewed and  Negative to the above problem except as noted.    PHYSICAL EXAM: VS:  BP  110/70 (BP Location: Left Arm, Patient Position: Sitting, Cuff Size: Normal)   Pulse 66   Ht '5\' 6"'$  (1.676 m)   Wt 129 lb (58.5 kg)   BMI 20.82 kg/m   GEN: Thin 66 yo , in no acute distress  HEENT: normal  Neck: no JVD, carotid bruit Cardiac: RRR; no murmurs  No LE edema  Respiratory:  clear to auscultation bilaterally, GI: soft, nontender, nondistended, + BS  No hepatomegaly  MS: no deformity Moving all extremities   Skin: warm and dry, no rash Neuro:  Strength and sensation are intact Psych: euthymic mood, full affect   EKG:  EKG is ordered today.  NSR 66   Lipid Panel No results found for: "CHOL", "TRIG", "HDL", "CHOLHDL", "VLDL", "LDLCALC", "LDLDIRECT"    Wt Readings from Last 3 Encounters:  09/03/22 129 lb (58.5 kg)  05/20/12 130 lb (59 kg)      ASSESSMENT AND PLAN:  1  HL  Pt with profound dyslipidemia    Ca score is 0   minimal plaquing  Goal for LDL   Will need to review labs  (lipomed, Lpa, ApoB )     Probably repeat Caa score in 5 years   2  HCM   Will get A1C     Cut out sugar      Tentative plan for follow up in 1 year     Current medicines are reviewed at length with the patient today.  The patient does not have concerns regarding medicines.  Signed, Dorris Carnes, MD  09/03/2022 9:06 PM    [facility] [address], [place], [place]  [address] Phone: [phone]; Fax: [phone]

## 2022-09-03 NOTE — Patient Instructions (Signed)
Medication Instructions:   *If you need a refill on your cardiac medications before your next appointment, please call your pharmacy*   Lab Work: Nmr, lipoa , apob,  hgba1c, hepatic today   If you have labs (blood work) drawn today and your tests are completely normal, you will receive your results only by: [place] (if you have MyChart) OR A paper copy in the mail If you have any lab test that is abnormal or we need to change your treatment, we will call you to review the results.   Testing/Procedures:    Follow-Up: At [facility], you and your health needs are our priority.  As part of our continuing mission to provide you with exceptional heart care, we have created designated Provider Care Teams.  These Care Teams include your primary Cardiologist (physician) and Advanced Practice Providers (APPs -  Physician Assistants and Nurse Practitioners) who all work together to provide you with the care you need, when you need it.  We recommend signing up for the patient portal called "MyChart".  Sign up information is provided on this After Visit Summary.  MyChart is used to connect with patients for Virtual Visits (Telemedicine).  Patients are able to view lab/test results, encounter notes, upcoming appointments, etc.  Non-urgent messages can be sent to your provider as well.   To learn more about what you can do with MyChart, go to [email or web address].    Your next appointment:   1 year(s)  The format for your next appointment:   In Person  Provider:   Dr Dorris Carnes     Other [place]

## 2022-09-04 LAB — NMR, LIPOPROFILE
Cholesterol, Total: 220 mg/dL — ABNORMAL HIGH (ref 100–199)
HDL Particle Number: 35.6 umol/L (ref >=30.5)
HDL-C: 85 mg/dL (ref >39)
LDL Particle Number: 880 nmol/L (ref <1000)
LDL Size: 22.3 nm (ref >20.5)
LDL-C (NIH Calc): 116 mg/dL — ABNORMAL HIGH (ref 0–99)
LP-IR Score: 35 (ref <=45)
Small LDL Particle Number: <90 nmol/L (ref <=527)
Triglycerides: 110 mg/dL (ref 0–149)

## 2022-09-04 LAB — LIPOPROTEIN A (LPA): Lipoprotein (a): 24.4 nmol/L (ref <75.0)

## 2022-09-04 LAB — HEMOGLOBIN A1C
Est. average glucose Bld gHb Est-mCnc: 111 mg/dL
Hgb A1c MFr Bld: 5.5 % (ref 4.8–5.6)

## 2022-09-04 LAB — HEPATIC FUNCTION PANEL
ALT: 19 IU/L (ref 0–32)
AST: 24 IU/L (ref 0–40)
Albumin: 4.5 g/dL (ref 3.9–4.9)
Alkaline Phosphatase: 81 IU/L (ref 44–121)
Bilirubin Total: 0.3 mg/dL (ref 0.0–1.2)
Bilirubin, Direct: 0.11 mg/dL (ref 0.00–0.40)
Total Protein: 6.8 g/dL (ref 6.0–8.5)

## 2022-09-04 LAB — APOLIPOPROTEIN B: Apolipoprotein B: 79 mg/dL (ref <90)

## 2022-09-05 DIAGNOSIS — E785 Hyperlipidemia, unspecified: Secondary | ICD-10-CM | POA: Diagnosis not present

## 2022-09-05 DIAGNOSIS — E041 Nontoxic single thyroid nodule: Secondary | ICD-10-CM | POA: Diagnosis not present

## 2022-09-05 DIAGNOSIS — Z131 Encounter for screening for diabetes mellitus: Secondary | ICD-10-CM | POA: Diagnosis not present

## 2022-09-05 DIAGNOSIS — Z13228 Encounter for screening for other metabolic disorders: Secondary | ICD-10-CM | POA: Diagnosis not present

## 2022-09-05 DIAGNOSIS — Z1231 Encounter for screening mammogram for malignant neoplasm of breast: Secondary | ICD-10-CM | POA: Diagnosis not present

## 2022-09-07 ENCOUNTER — Telehealth: Payer: Self-pay

## 2022-09-07 DIAGNOSIS — E7849 Other hyperlipidemia: Secondary | ICD-10-CM

## 2022-09-07 DIAGNOSIS — Z79899 Other long term (current) drug therapy: Secondary | ICD-10-CM

## 2022-09-07 DIAGNOSIS — Z13220 Encounter for screening for lipoid disorders: Secondary | ICD-10-CM

## 2022-09-07 MED ORDER — ROSUVASTATIN CALCIUM 20 MG PO TABS: 20.0000 mg | ORAL_TABLET | Freq: Every day | ORAL | 3 refills | Status: DC

## 2022-09-07 NOTE — Telephone Encounter (Signed)
Result and message sent to the pts My Chart for their review.   Need lab date.

## 2022-09-07 NOTE — Telephone Encounter (Signed)
-----   Message from Fay Records, MD sent at 09/05/2022  2:09 PM EST ----- LDL is 116    THis is a marked improvement from previous  PRoteins ApoB and Lpa are low (good) Ca score is 0 but she had mlid plaquing  in aorta I would probably increase Crestor to 20 mg    Follow lipomed in 12 wks to see the change in LDL With plaquing LDL goal is usually 70   She has had a marked change   Improved on this alone   PRbably goal below 100

## 2022-09-19 NOTE — Telephone Encounter (Signed)
Left a message for the pt to call back.  

## 2022-09-19 NOTE — Telephone Encounter (Signed)
Patient returned RN's call. 

## 2022-10-22 NOTE — Telephone Encounter (Signed)
My Chart letter sent to the pt for follow up of her labs and need to make her a follow up lab appt if she has increased her Crestor to '20mg'$ .

## 2022-10-31 NOTE — Telephone Encounter (Signed)
See My Chart note.. pt to have labs 12/04/22

## 2022-11-07 IMAGING — US US THYROID
1 series · 13 of 25 positions shown · non-contrast
Comparison: Prior thyroid ultrasound 12/18/2016

CLINICAL DATA: Goiter. Prior left inferior thyroid nodule biopsied
in Thursday March, 2017

EXAM:
THYROID ULTRASOUND
TECHNIQUE: Ultrasound examination of the thyroid gland and adjacent soft
tissues was performed.

[Series 1: us thyroid · 0.05mm/px · 13 of 64 slices shown]
[im 1/64]
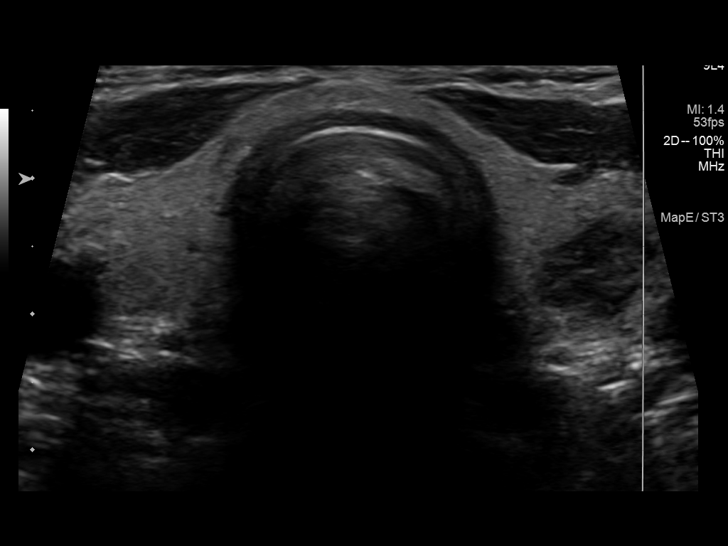
[im 6/64]
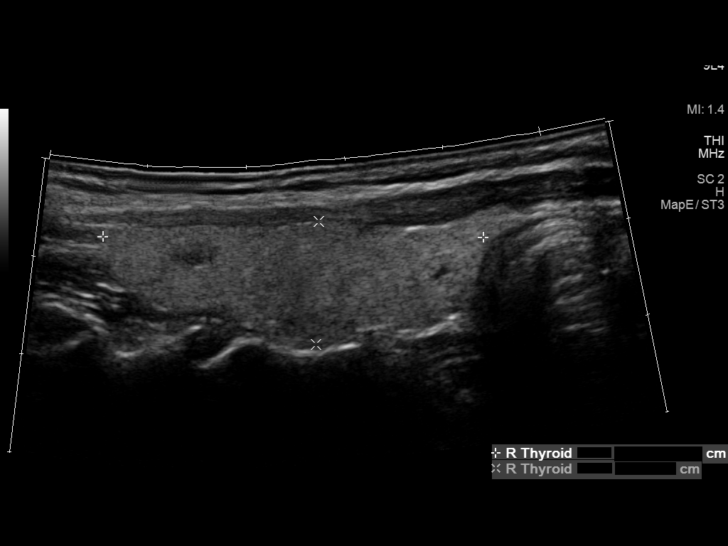
[im 11/64]
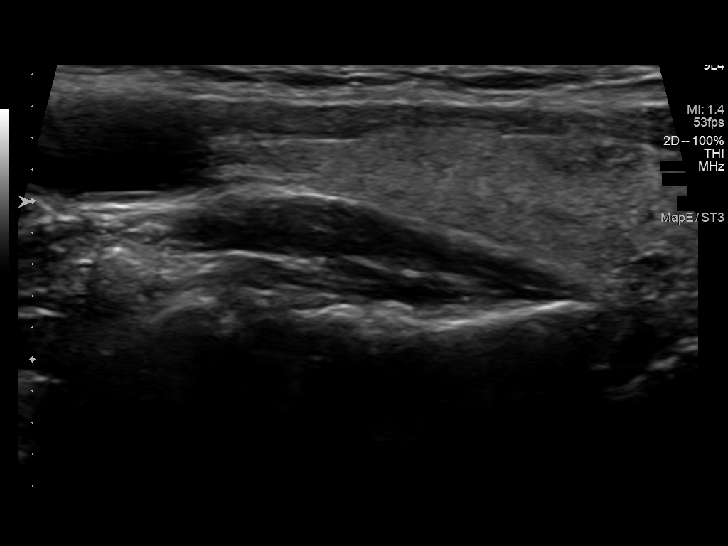
[im 16/64]
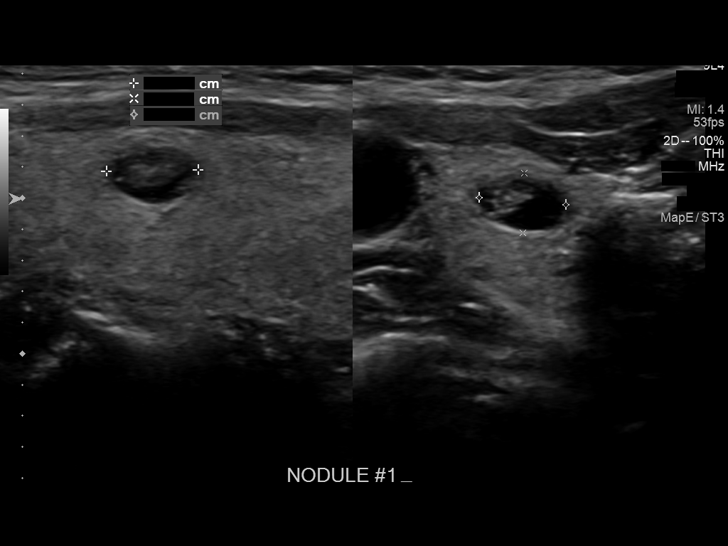
[im 22/64]
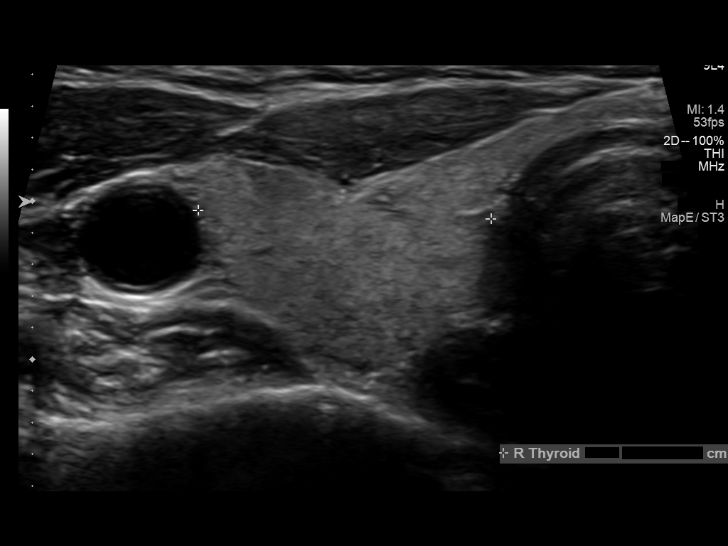
[im 27/64]
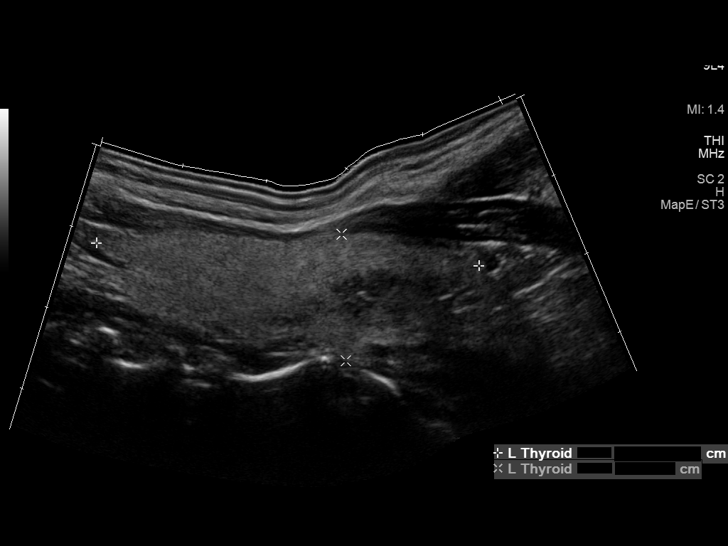
[im 32/64]
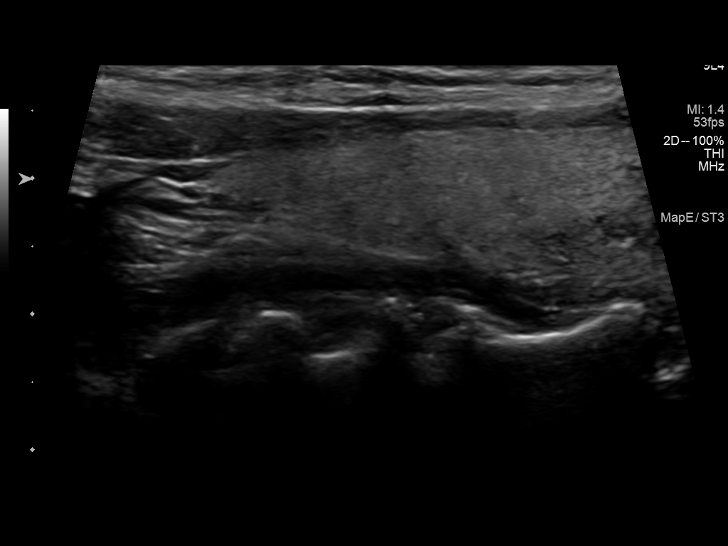
[im 37/64]
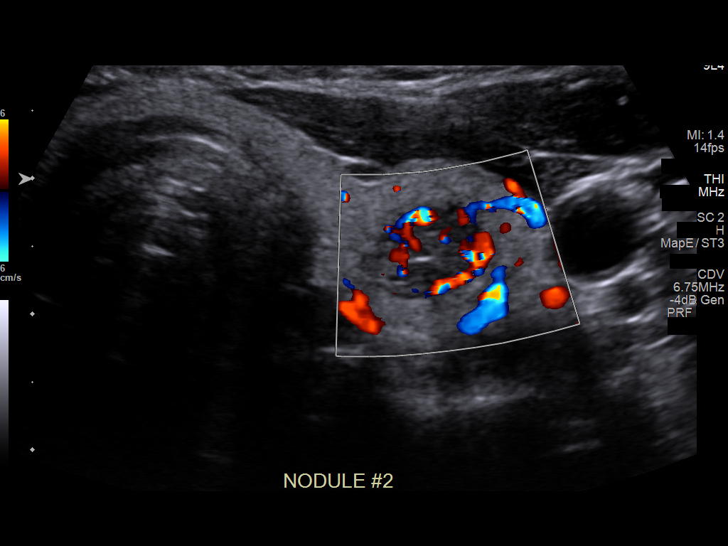
[im 43/64]
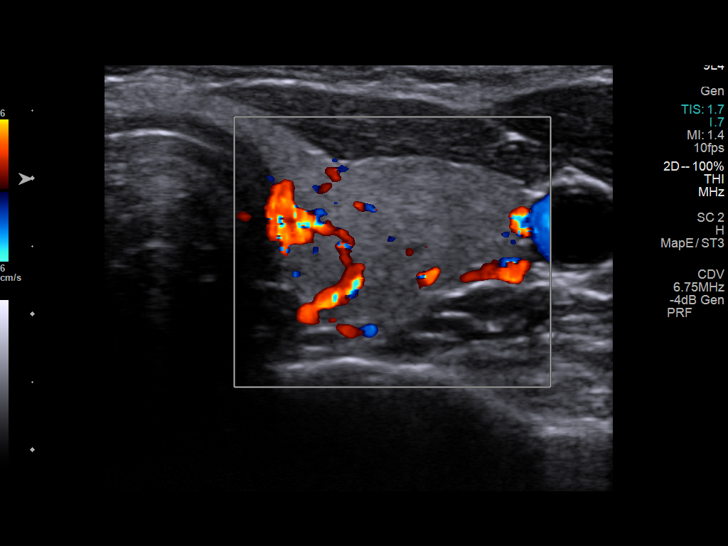
[im 48/64]
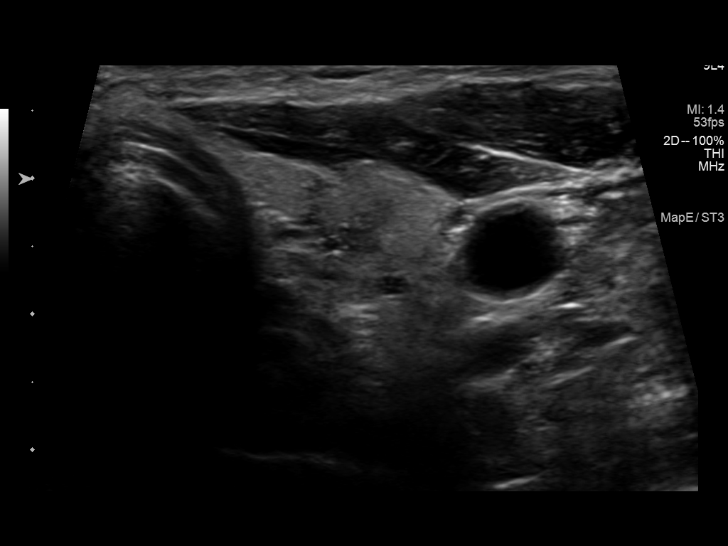
[im 53/64]
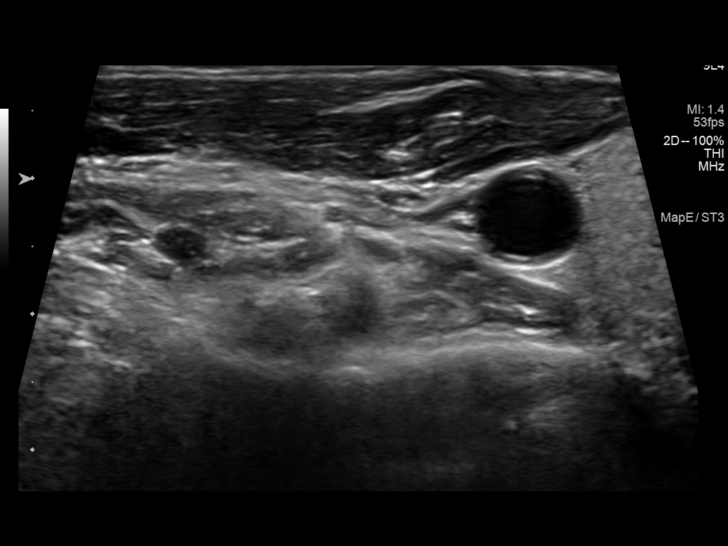
[im 58/64]
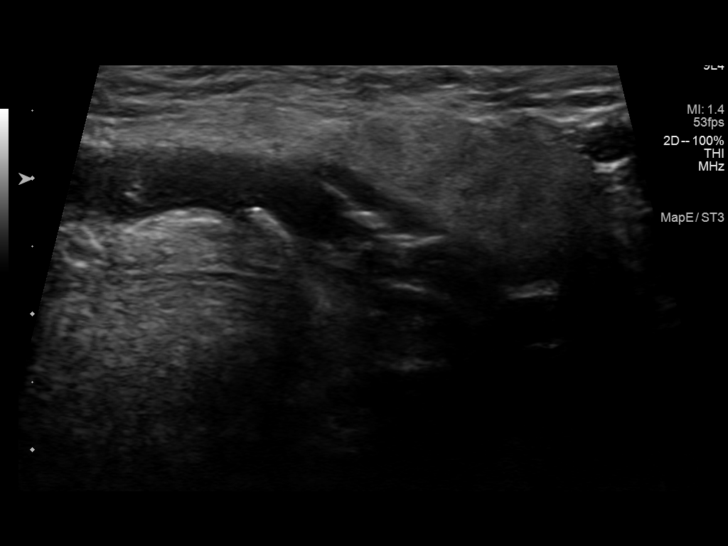
[im 64/64]
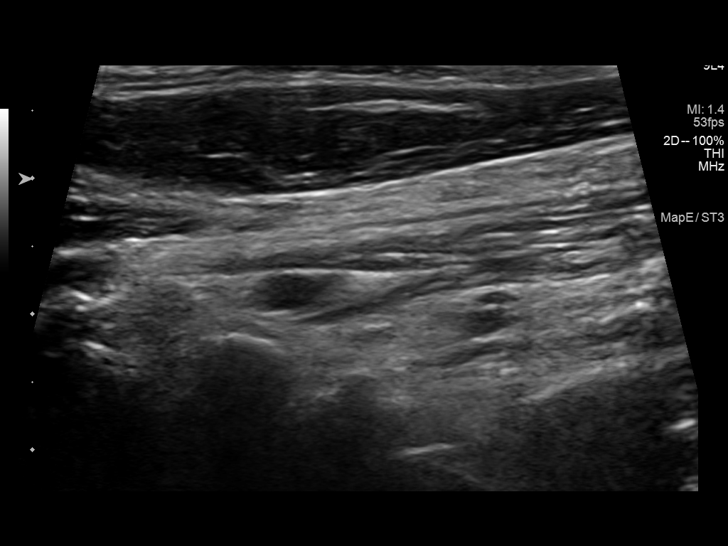

[13 of 25 positions shown; findings below may reference images not displayed]

FINDINGS: Parenchymal Echotexture: Normal

Isthmus: 0.2 cm

Right lobe: 3.9 x 1.2 x 1.9 cm

Left lobe: 4.5 x 1.5 x 1.8 cm

_________________________________________________________

Estimated total number of nodules >/= 1 cm: 1

Number of spongiform nodules >/=  2 cm not described below (TR1): 0

Number of mixed cystic and solid nodules >/= 1.5 cm not described
below (TR2): 0

_________________________________________________________

The previously biopsied nodule in the left inferior gland measures
unchanged at 1.3 x 0.8 x 0.9 cm. The nodule is unchanged in
morphology being solid, hypoechoic with punctate internal echogenic
foci.

No new nodules or suspicious features identified. Small mixed cystic
and solid nodule in the right upper gland remains unchanged and does
not meet criteria for further evaluation.
IMPRESSION: No significant interval change in the size or appearance of the
previously biopsied nodule in the left inferior gland. 4 year
stability is highly consistent with a benign process. Recommend
correlation with prior biopsy results. Presuming benign biopsy
results, no further imaging follow-up would be recommended.

No new nodules or suspicious features.

The above is in keeping with the ACR TI-RADS recommendations - [HOSPITAL] 8102;[DATE].

## 2022-12-04 ENCOUNTER — Ambulatory Visit: Payer: BC Managed Care – PPO

## 2023-01-02 ENCOUNTER — Ambulatory Visit: Payer: BC Managed Care – PPO | Attending: Internal Medicine

## 2023-01-02 DIAGNOSIS — E7849 Other hyperlipidemia: Secondary | ICD-10-CM

## 2023-01-02 DIAGNOSIS — Z79899 Other long term (current) drug therapy: Secondary | ICD-10-CM | POA: Diagnosis not present

## 2023-01-03 LAB — NMR, LIPOPROFILE
Cholesterol, Total: 201 mg/dL — ABNORMAL HIGH (ref 100–199)
HDL Particle Number: 36.1 umol/L (ref >=30.5)
HDL-C: 79 mg/dL (ref >39)
LDL Particle Number: 1059 nmol/L — ABNORMAL HIGH (ref <1000)
LDL Size: 21.8 nm (ref >20.5)
LDL-C (NIH Calc): 102 mg/dL — ABNORMAL HIGH (ref 0–99)
LP-IR Score: 33 (ref <=45)
Small LDL Particle Number: <90 nmol/L (ref <=527)
Triglycerides: 115 mg/dL (ref 0–149)

## 2023-01-06 NOTE — Telephone Encounter (Signed)
Per Dr Tenny Craw:  LDL is 102   HDL 79    Very Good   Total chol 201   Confirm meds she is taking

## 2023-01-21 DIAGNOSIS — M25562 Pain in left knee: Secondary | ICD-10-CM | POA: Diagnosis not present

## 2023-01-22 ENCOUNTER — Telehealth: Payer: Self-pay | Admitting: Internal Medicine

## 2023-01-22 NOTE — Telephone Encounter (Signed)
Pt called to report that she is taking Crestor 20 mg daily and asking where to go from here based on her last Lipid panel.

## 2023-01-22 NOTE — Telephone Encounter (Signed)
Patient is returning call.  °

## 2023-01-24 NOTE — Telephone Encounter (Signed)
My Chart message sent to the pt. Will follow up with her.

## 2023-01-24 NOTE — Telephone Encounter (Signed)
I would recomm adding Zetia (different mechanism of action from Crestor) to see of helps lower LDL since change in Crestor dose did not have a great effect   10 mg    Continue to watch diet (Mediterranean style) and exercise  Follow up lipomed and liver panel in 8 wks

## 2023-01-28 ENCOUNTER — Telehealth: Payer: Self-pay | Admitting: Internal Medicine

## 2023-01-28 NOTE — Telephone Encounter (Signed)
Patient is asking that the nurse gives her a call back. Please advise  

## 2023-01-29 NOTE — Telephone Encounter (Signed)
I spoke with the pt and she reports that she hurt her knee and has not been as physically active as she should have been. She is just getting back into exercise and asks that we hold off on the Zetia for now.. we went over the benefits and she says that she will wait until after her next set of labs and if no improvement she would be willing to ry and add it on.   She will have labs in 8 weeks but also asked that she call us or send a My Chart to schedule when is gets closer but she promises to follow up with [place].

## 2023-01-29 NOTE — Telephone Encounter (Signed)
See other result encounter.

## 2023-09-06 ENCOUNTER — Other Ambulatory Visit: Payer: Self-pay | Admitting: Internal Medicine

## 2023-09-16 DIAGNOSIS — M816 Localized osteoporosis [Lequesne]: Secondary | ICD-10-CM | POA: Diagnosis not present

## 2023-09-16 DIAGNOSIS — Z6820 Body mass index (BMI) 20.0-20.9, adult: Secondary | ICD-10-CM | POA: Diagnosis not present

## 2023-09-16 DIAGNOSIS — Z1231 Encounter for screening mammogram for malignant neoplasm of breast: Secondary | ICD-10-CM | POA: Diagnosis not present

## 2023-09-18 ENCOUNTER — Other Ambulatory Visit: Payer: Self-pay | Admitting: Obstetrics and Gynecology

## 2023-09-18 DIAGNOSIS — E041 Nontoxic single thyroid nodule: Secondary | ICD-10-CM

## 2023-09-30 ENCOUNTER — Other Ambulatory Visit: Payer: Self-pay | Admitting: Internal Medicine

## 2023-10-01 DIAGNOSIS — L821 Other seborrheic keratosis: Secondary | ICD-10-CM | POA: Diagnosis not present

## 2023-10-01 DIAGNOSIS — L814 Other melanin hyperpigmentation: Secondary | ICD-10-CM | POA: Diagnosis not present

## 2023-10-01 DIAGNOSIS — L578 Other skin changes due to chronic exposure to nonionizing radiation: Secondary | ICD-10-CM | POA: Diagnosis not present

## 2023-10-01 DIAGNOSIS — L570 Actinic keratosis: Secondary | ICD-10-CM | POA: Diagnosis not present

## 2023-10-01 DIAGNOSIS — D229 Melanocytic nevi, unspecified: Secondary | ICD-10-CM | POA: Diagnosis not present

## 2023-10-01 DIAGNOSIS — Z86018 Personal history of other benign neoplasm: Secondary | ICD-10-CM | POA: Diagnosis not present

## 2023-10-25 ENCOUNTER — Other Ambulatory Visit: Payer: Self-pay | Admitting: Internal Medicine

## 2023-11-05 ENCOUNTER — Other Ambulatory Visit: Payer: Self-pay | Admitting: Internal Medicine

## 2023-11-24 ENCOUNTER — Ambulatory Visit
Admission: RE | Admit: 2023-11-24 | Discharge: 2023-11-24 | Disposition: A | Discharge status: Home / Self Care | Payer: BC Managed Care – PPO | Source: Ambulatory Visit | Attending: Obstetrics and Gynecology | Admitting: Obstetrics and Gynecology

## 2023-11-24 DIAGNOSIS — E049 Nontoxic goiter, unspecified: Secondary | ICD-10-CM | POA: Diagnosis not present

## 2023-11-24 DIAGNOSIS — E041 Nontoxic single thyroid nodule: Secondary | ICD-10-CM

## 2023-11-26 ENCOUNTER — Telehealth: Payer: Self-pay | Admitting: Internal Medicine

## 2023-11-26 NOTE — Telephone Encounter (Signed)
 Pt wants to know if Dr Tenny Craw wants labs before her appt in June. Please advise

## 2023-11-26 NOTE — Telephone Encounter (Signed)
 Spoke with pt regarding labs. No labs were ordered to be done prior to her appointment in June. Pt was notified. Pt verbalized understanding. All questions, if any, were answered.

## 2023-12-03 ENCOUNTER — Other Ambulatory Visit: Payer: Self-pay

## 2023-12-03 ENCOUNTER — Telehealth: Payer: Self-pay | Admitting: Internal Medicine

## 2023-12-03 MED ORDER — ROSUVASTATIN CALCIUM 20 MG PO TABS: 20.0000 mg | ORAL_TABLET | Freq: Every day | ORAL | 1 refills | Status: DC

## 2023-12-03 NOTE — Telephone Encounter (Signed)
*  STAT* If patient is at the pharmacy, call can be transferred to refill team.   1. Which medications need to be refilled? (please list name of each medication and dose if known) rosuvastatin (CRESTOR) 20 MG tablet   2. Which pharmacy/location (including street and city if local pharmacy) is medication to be sent to?  [id number] - [place], [place] - [address]    3. Do they need a 30 day or 90 day supply? 90  Patient has appt on 02/23/24

## 2024-02-22 NOTE — Progress Notes (Unsigned)
 Cardiology Office Note    Patient Name: Amber Lang Date of Encounter: 02/23/2024  Primary Care Provider:  Patient, No Pcp Per Primary Cardiologist:  None Primary Electrophysiologist: None   Past Medical History    Past Medical History:  Diagnosis Date   Atherosclerosis of aorta (HCC)    Hyperlipemia    Osteoporosis    Pure hypercholesterolemia    Thyroid  nodule    Tinea corporis     History of Present Illness  Amber Lang is a 68 y.o. female with a PMH of atherosclerosis, hyperlipidemia, premature CAD thyroid  nodule who presents today for annual follow-up.  Amber Lang was seen initially by Dr. Avanell Bob on 09/03/2022 for evaluation of hyperlipidemia and atherosclerosis seen on CT calcium  score.  She has a history of premature CAD with father dying from MI at age 42.  She completed LP(a) that was within normal limits and LDL cholesterol was 116 with Crestor  increased to 20 mg daily.  She underwent repeat lipids with LDL at 102 total cholesterol 201 and had recommendation of ezetimibe 10 mg added to regimen however patient elected to hold off on Zetia at this time. She had Thyroid  check recently and was negative.  Amber Lang presents today for annual follow-up.  Ports dShe wants to reduce her Crestor  dosage if her cholesterol levels improve further. Since retiring in August of the previous year, she has made significant lifestyle changes, including joining the [facility] and attending five to six exercise classes weekly. Her diet is very healthy, consisting of salads without dressing or cheese, vegetables, avocado, blueberries, and lean proteins like ground [place]. She is interested in potentially reducing her medication if her cholesterol levels are sufficiently low. She experiences no side effects from Crestor , such as muscle aches, and is not taking any other medications except for a bone density medication. She prefers to avoid additional medications, particularly those  affecting her gastrointestinal system. She is curious about natural supplements but is cautious about their interactions with her current medication. She has a history of a thyroid  nodule, which was recently re-evaluated and found to be stable with no changes. This condition is followed by Dr. McComb, and she reports no current issues related to it. She occasionally experiences tingling in her arm, which she attributes to possible muscle strain or a pinched nerve from increased physical activity. This symptom has decreased over time and does not currently cause significant concern.oing well since her previous follow-up with no new cardiac complaints. Patient denies chest pain, palpitations, dyspnea, PND, orthopnea, nausea, vomiting, dizziness, syncope, edema, weight gain, or early satiety.  Discussed the use of AI scribe software for clinical note transcription with the patient, who gave verbal consent to proceed.  History of Present Illness   Review of Systems  Please see the history of present illness.    All other systems reviewed and are otherwise negative except as noted above.  Physical Exam     Wt Readings from Last 3 Encounters:  02/23/24 124 lb (56.2 kg)  09/03/22 129 lb (58.5 kg)  05/20/12 130 lb (59 kg)   VS: Vitals:   02/23/24 1037 02/23/24 1128  BP: (!) 138/98 (!) 148/84  Pulse: (!) 58   SpO2: 97%   ,Body mass index is 20.01 kg/m. GEN: Well nourished, well developed in no acute distress Neck: No JVD; No carotid bruits Pulmonary: Clear to auscultation without rales, wheezing or rhonchi  Cardiovascular: Normal rate. Regular rhythm. Normal S1. Normal S2.   Murmurs: There is  no murmur.  ABDOMEN: Soft, non-tender, non-distended EXTREMITIES:  No edema; No deformity   EKG/LABS/ Recent Cardiac Studies   ECG personally reviewed by me today -sinus bradycardia with rate of 58 bpm and no acute changes consistent with previous EKG.  Risk Assessment/Calculations:          Lab  Results  Component Value Date   WBC 5.4 05/20/2012   HGB 12.1 05/20/2012   HCT 36.8 05/20/2012   MCV 92.7 05/20/2012   PLT 287 05/20/2012   Lab Results  Component Value Date   CREATININE 0.70 05/20/2012   BUN 11 05/20/2012   NA 139 05/20/2012   K 4.0 05/20/2012   CL 102 05/20/2012   CO2 25 05/20/2012   No results found for: "CHOL", "HDL", "LDLCALC", "LDLDIRECT", "TRIG", "CHOLHDL"  Lab Results  Component Value Date   HGBA1C 5.5 09/03/2022   Assessment & Plan    Assessment & Plan  1.  Hyperlipidemia: -LDL reduced from 116 mg/dL to 161 mg/dL with Crestor  20 mg. Goal LDL is 100 mg/dL or less. She prefers to avoid ezetimibe due to gastrointestinal concerns. - Order fasting lipid panel to reassess LDL levels. - Continue Crestor  20 mg daily. - Consider reducing Crestor  to 10 mg if LDL significantly improves. - Discuss potential use of ezetimibe or consultation with a lipid management pharmacist if LDL remains above goal.  2.  Aortic atherosclerosis -Atherosclerosis with family history of premature coronary disease. Elevated LDL cholesterol above goal despite lifestyle changes. Lipoprotein A normal. - Order fasting lipid panel and liver function tests. - Continue lifestyle modifications including exercise and diet. - Consider reducing Crestor  dosage if LDL levels significantly improve. - Discuss potential referral to a lipid management pharmacist if LDL remains above goal  3.  History of premature CAD - Patient reports no chest pain or new cardiac complaints since previous follow-up. - Continue low-sodium heart healthy diet and Crestor  20 mg daily  4.  Elevated blood pressure: HYPERTENSION CONTROL Vitals:   02/23/24 1037 02/23/24 1128  BP: (!) 138/98 (!) 148/84    The patient's blood pressure is elevated above target today.  In order to address the patient's elevated BP: Blood pressure will be monitored at home to determine if medication changes need to be made.       -Blood pressure elevated today at 138/98 and was 148/84 on recheck. - likely situational. Usual readings around 110/60 mmHg. No immediate hypertension concerns. - Recheck blood pressure at the end of the visit. - Instruct to monitor blood pressure at home twice daily and log results. - Set goal blood pressure at 130/80 mmHg or less. - Upload blood pressure log to MyChart for review.     Disposition: Follow-up with None or APP in 12 months    Signed, Francene Ing, Retha Cast, NP 02/23/2024, 11:28 AM [facility]

## 2024-02-23 ENCOUNTER — Other Ambulatory Visit: Payer: Self-pay

## 2024-02-23 ENCOUNTER — Ambulatory Visit: Attending: Nurse Practitioner | Admitting: Nurse Practitioner

## 2024-02-23 ENCOUNTER — Encounter: Payer: Self-pay | Admitting: Nurse Practitioner

## 2024-02-23 VITALS — BP 148/84 | HR 58 | Ht 66.0 in | Wt 124.0 lb

## 2024-02-23 DIAGNOSIS — E785 Hyperlipidemia, unspecified: Secondary | ICD-10-CM

## 2024-02-23 DIAGNOSIS — I700 Atherosclerosis of aorta: Secondary | ICD-10-CM

## 2024-02-23 DIAGNOSIS — Z8249 Family history of ischemic heart disease and other diseases of the circulatory system: Secondary | ICD-10-CM | POA: Diagnosis not present

## 2024-02-23 DIAGNOSIS — R030 Elevated blood-pressure reading, without diagnosis of hypertension: Secondary | ICD-10-CM

## 2024-02-23 MED ORDER — ROSUVASTATIN CALCIUM 20 MG PO TABS: 20.0000 mg | ORAL_TABLET | Freq: Every day | ORAL | 3 refills | Status: AC

## 2024-02-23 NOTE — Patient Instructions (Addendum)
 Medication Instructions:  Your physician recommends that you continue on your current medications as directed. Please refer to the Current Medication list given to you today. *If you need a refill on your cardiac medications before your next appointment, please call your pharmacy*  Lab Work: None ordered If you have labs (blood work) drawn today and your tests are completely normal, you will receive your results only by: MyChart Message (if you have MyChart) OR A paper copy in the mail If you have any lab test that is abnormal or we need to change your treatment, we will call you to review the results.  Testing/Procedures: None ordered   Follow-Up: At [facility], you and your health needs are our priority.  As part of our continuing mission to provide you with exceptional heart care, our providers are all part of one team.  This team includes your primary Cardiologist (physician) and Advanced Practice Providers or APPs (Physician Assistants and Nurse Practitioners) who all work together to provide you with the care you need, when you need it.  Your next appointment:   12 month(s)  Provider:   Ola Berger, MD  We recommend signing up for the patient portal called "MyChart".  Sign up information is provided on this After Visit Summary.  MyChart is used to connect with patients for Virtual Visits (Telemedicine).  Patients are able to view lab/test results, encounter notes, upcoming appointments, etc.  Non-urgent messages can be sent to your provider as well.   To learn more about what you can do with MyChart, go to [email or web address].   Other Instructions Check your blood pressure daily for 2 weeks, then contact the office with your readings.  Contact the office either by phone or MyChart with your readings.  Make sure to check your blood pressure 2 hours after taking your medications.   AVOID these things for 30 minutes before checking your blood pressure: No Drinking  caffeine. No Drinking alcohol. No Eating. No Smoking. No Exercising.  Five minutes before checking your blood pressure: Pee. Sit in a dining chair. Avoid sitting in a soft couch or armchair. Be quiet. Do not talk.

## 2024-02-24 DIAGNOSIS — E785 Hyperlipidemia, unspecified: Secondary | ICD-10-CM | POA: Diagnosis not present

## 2024-02-24 DIAGNOSIS — I700 Atherosclerosis of aorta: Secondary | ICD-10-CM | POA: Diagnosis not present

## 2024-02-24 DIAGNOSIS — Z8249 Family history of ischemic heart disease and other diseases of the circulatory system: Secondary | ICD-10-CM | POA: Diagnosis not present

## 2024-02-25 ENCOUNTER — Ambulatory Visit: Payer: Self-pay | Admitting: Nurse Practitioner

## 2024-02-25 DIAGNOSIS — E7849 Other hyperlipidemia: Secondary | ICD-10-CM

## 2024-02-25 DIAGNOSIS — Z79899 Other long term (current) drug therapy: Secondary | ICD-10-CM

## 2024-02-25 DIAGNOSIS — E785 Hyperlipidemia, unspecified: Secondary | ICD-10-CM

## 2024-02-25 DIAGNOSIS — Z8249 Family history of ischemic heart disease and other diseases of the circulatory system: Secondary | ICD-10-CM

## 2024-02-25 DIAGNOSIS — Z13220 Encounter for screening for lipoid disorders: Secondary | ICD-10-CM

## 2024-02-25 LAB — HEPATIC FUNCTION PANEL
ALT: 24 IU/L (ref 0–32)
AST: 29 IU/L (ref 0–40)
Albumin: 4.6 g/dL (ref 3.9–4.9)
Alkaline Phosphatase: 85 IU/L (ref 44–121)
Bilirubin Total: 0.4 mg/dL (ref 0.0–1.2)
Bilirubin, Direct: 0.12 mg/dL (ref 0.00–0.40)
Total Protein: 7.3 g/dL (ref 6.0–8.5)

## 2024-02-25 LAB — LIPID PANEL
Chol/HDL Ratio: 2.6 ratio (ref 0.0–4.4)
Cholesterol, Total: 231 mg/dL — ABNORMAL HIGH (ref 100–199)
HDL: 89 mg/dL (ref >39)
LDL Chol Calc (NIH): 128 mg/dL — ABNORMAL HIGH (ref 0–99)
Triglycerides: 79 mg/dL (ref 0–149)
VLDL Cholesterol Cal: 14 mg/dL (ref 5–40)

## 2024-03-03 DIAGNOSIS — H2513 Age-related nuclear cataract, bilateral: Secondary | ICD-10-CM | POA: Diagnosis not present

## 2024-04-13 ENCOUNTER — Other Ambulatory Visit: Payer: Self-pay | Admitting: Pharmacist Clinician (PhC)/ Clinical Pharmacy Specialist

## 2024-04-13 ENCOUNTER — Encounter: Payer: Self-pay | Admitting: Pharmacist Clinician (PhC)/ Clinical Pharmacy Specialist

## 2024-04-13 ENCOUNTER — Ambulatory Visit: Attending: Internal Medicine | Admitting: Pharmacist Clinician (PhC)/ Clinical Pharmacy Specialist

## 2024-04-13 VITALS — BP 142/86

## 2024-04-13 DIAGNOSIS — E785 Hyperlipidemia, unspecified: Secondary | ICD-10-CM

## 2024-04-13 DIAGNOSIS — R030 Elevated blood-pressure reading, without diagnosis of hypertension: Secondary | ICD-10-CM | POA: Diagnosis not present

## 2024-04-13 MED ORDER — EZETIMIBE 10 MG PO TABS: 10.0000 mg | ORAL_TABLET | Freq: Every day | ORAL | 3 refills | Status: AC

## 2024-04-13 NOTE — Assessment & Plan Note (Signed)
 Patient was noted to have an elevated diastolic pressure when she saw Jackee Alberts in June.  He asked her to monitor at home and send readings in.  She brought those with her today.  Home readings average 116/71.  In office today checked and found to be 142/86.  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

## 2024-04-13 NOTE — Patient Instructions (Addendum)
 Your Results:             Your most recent labs Goal  Total Cholesterol 231 < 200  Triglycerides 79 < 150  HDL (happy/good cholesterol) 89 > 40  LDL (lousy/bad cholesterol 128 < 100   Medication changes:  Start ezetimibe  (Zetia ) 10 mg once daily.   Continue rosuvastatin  20 mg once daily  Lab orders:  We want to repeat labs after about 6-8 weeks.   We will send you a lab order to remind you once we get closer to that time.    Thank you for choosing [facility]

## 2024-04-13 NOTE — Assessment & Plan Note (Signed)
 Assessment: Patient not at LDL goal of < 100 Most recent LDL 128 on 02/24/24 Has been compliant with high intensity statin : rosuvastatin  20 mg daily Reviewed options for lowering LDL cholesterol, including ezetimibe , PCSK-9 inhibitors, bempedoic acid.  Discussed mechanisms of action, dosing, side effects, potential decreases in LDL cholesterol and costs.  Also reviewed potential options for patient assistance.  Plan: Patient agreeable to starting ezetimibe  10 mg daily  (she can start with 5 mg daily if she has concerns for GI side effects) Repeat labs after:  6 weeks Lipid Liver function

## 2024-04-13 NOTE — Progress Notes (Signed)
 Office Visit    Patient Name: Amber Lang Date of Encounter: 04/13/2024  Primary Care Provider:  Patient, No Pcp Per Primary Cardiologist:  None  Chief Complaint    Hyperlipidemia   Significant Past Medical History   CAD Aortic atherosclerosis, seen on CT, CAC = 0  Elevated diastolic BP In office DBP > 80, at home seeing more 65-75.  Will have patient bring home device for validation     Allergies  Allergen Reactions   Seconal [Secobarbital Sodium] Other (See Comments)    Childhood Reaction.   Codeine Nausea And Vomiting   Entex Lq [Phenylephrine-Guaifenesin]     History of Present Illness    Amber Lang is a 68 y.o. female patient of Dr Okey, in the office today to discuss options for cholesterol management.  Insurance Carrier:  [employer]  LDL Cholesterol goal:  LDL < 100  Current Medications: rosuvastatin  20 mg daily  (since 2022/08/21)  Family Hx:  father died from MI at 55, mother had elevated cholesterol; one sister, no heart disease; daughter 80, son 30, unsure if they have elevated cholesterol  Social Hx: Tobacco: no Alcohol: wine in the evenings 1-2 glasses     Diet:   eats healthy 6 days, cheats Sunday with family gathering - jokes that it's healthy cheating ; late morning - smoothie (protein powder, frozen fruit, yogurt, flax; dinner 10 pm - salad with protein, salmon several times per week; snacks ice cream 1-2 x per week,    Exercise: joined [employer] 11 months ago, has gone regularly since joining  Accessory Clinical Findings   Lab Results  Component Value Date   CHOL 231 (H) 02/24/2024   HDL 89 02/24/2024   LDLCALC 128 (H) 02/24/2024   TRIG 79 02/24/2024   CHOLHDL 2.6 02/24/2024    Lipoprotein (a)  Date/Time Value Ref Range Status  09/03/2022 02:33 PM 24.4 <75.0 nmol/L Final    Comment:    Note:  Values greater than or equal to 75.0 nmol/L may        indicate an independent risk factor for CHD,        but must be  evaluated with caution when applied        to non-Caucasian populations due to the        influence of genetic factors on Lp(a) across        ethnicities.     Lab Results  Component Value Date   ALT 24 02/24/2024   AST 29 02/24/2024   ALKPHOS 85 02/24/2024   BILITOT 0.4 02/24/2024   Lab Results  Component Value Date   CREATININE 0.70 05/20/2012   BUN 11 05/20/2012   NA 139 05/20/2012   K 4.0 05/20/2012   CL 102 05/20/2012   CO2 25 05/20/2012   Lab Results  Component Value Date   HGBA1C 5.5 09/03/2022    Home Medications    Current Outpatient Medications  Medication Sig Dispense Refill   ezetimibe  (ZETIA ) 10 MG tablet Take 1 tablet (10 mg total) by mouth daily. 90 tablet 3   alendronate (FOSAMAX) 70 MG tablet Take 70 mg by mouth once a week.     rosuvastatin  (CRESTOR ) 20 MG tablet Take 1 tablet (20 mg total) by mouth daily. 90 tablet 3   No current facility-administered medications for this visit.     Assessment & Plan    Hyperlipidemia LDL goal <100 Assessment: Patient not at LDL goal of < 100 Most recent LDL 128  on 02/24/24 Has been compliant with high intensity statin : rosuvastatin  20 mg daily Reviewed options for lowering LDL cholesterol, including ezetimibe , PCSK-9 inhibitors, bempedoic acid.  Discussed mechanisms of action, dosing, side effects, potential decreases in LDL cholesterol and costs.  Also reviewed potential options for patient assistance.  Plan: Patient agreeable to starting ezetimibe  10 mg daily  (she can start with 5 mg daily if she has concerns for GI side effects) Repeat labs after:  6 weeks Lipid Liver function    Elevated blood pressure reading Patient was noted to have an elevated diastolic pressure when she saw Jackee Alberts in June.  He asked her to monitor at home and send readings in.  She brought those with her today.  Home readings average 116/71.  In office today checked and found to be 142/86.  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   [place], [place] [address] [phone]  04/13/2024, 4:20 PM

## 2024-06-04 DIAGNOSIS — E785 Hyperlipidemia, unspecified: Secondary | ICD-10-CM | POA: Diagnosis not present

## 2024-06-05 LAB — HEPATIC FUNCTION PANEL
ALT: 32 IU/L (ref 0–32)
AST: 31 IU/L (ref 0–40)
Albumin: 4.5 g/dL (ref 3.9–4.9)
Alkaline Phosphatase: 69 IU/L (ref 49–135)
Bilirubin Total: 0.3 mg/dL (ref 0.0–1.2)
Bilirubin, Direct: 0.14 mg/dL (ref 0.00–0.40)
Total Protein: 6.7 g/dL (ref 6.0–8.5)

## 2024-06-05 LAB — LIPID PANEL
Chol/HDL Ratio: 2.1 ratio (ref 0.0–4.4)
Cholesterol, Total: 166 mg/dL (ref 100–199)
HDL: 80 mg/dL (ref >39)
LDL Chol Calc (NIH): 75 mg/dL (ref 0–99)
Triglycerides: 52 mg/dL (ref 0–149)
VLDL Cholesterol Cal: 11 mg/dL (ref 5–40)

## 2024-06-07 ENCOUNTER — Ambulatory Visit (HOSPITAL_BASED_OUTPATIENT_CLINIC_OR_DEPARTMENT_OTHER): Payer: Self-pay | Admitting: Pharmacist Clinician (PhC)/ Clinical Pharmacy Specialist
